# Patient Record
Sex: Female | Born: 1945 | Race: White | Hispanic: No | Marital: Married | Smoking: Never smoker
Health system: Southern US, Community
[De-identification: ages and names within clinical notes are randomized; demographics above are authoritative.]

## PROBLEM LIST (undated history)

## (undated) DIAGNOSIS — T7840XA Allergy, unspecified, initial encounter: Secondary | ICD-10-CM

## (undated) DIAGNOSIS — L719 Rosacea, unspecified: Secondary | ICD-10-CM

## (undated) DIAGNOSIS — M51369 Other intervertebral disc degeneration, lumbar region without mention of lumbar back pain or lower extremity pain: Secondary | ICD-10-CM

## (undated) DIAGNOSIS — H269 Unspecified cataract: Secondary | ICD-10-CM

## (undated) DIAGNOSIS — M5136 Other intervertebral disc degeneration, lumbar region: Secondary | ICD-10-CM

## (undated) DIAGNOSIS — E785 Hyperlipidemia, unspecified: Secondary | ICD-10-CM

## (undated) DIAGNOSIS — K5790 Diverticulosis of intestine, part unspecified, without perforation or abscess without bleeding: Secondary | ICD-10-CM

## (undated) DIAGNOSIS — E039 Hypothyroidism, unspecified: Secondary | ICD-10-CM

## (undated) DIAGNOSIS — N6019 Diffuse cystic mastopathy of unspecified breast: Secondary | ICD-10-CM

## (undated) DIAGNOSIS — N200 Calculus of kidney: Secondary | ICD-10-CM

## (undated) DIAGNOSIS — M67439 Ganglion, unspecified wrist: Secondary | ICD-10-CM

## (undated) DIAGNOSIS — R87619 Unspecified abnormal cytological findings in specimens from cervix uteri: Secondary | ICD-10-CM

## (undated) DIAGNOSIS — IMO0002 Reserved for concepts with insufficient information to code with codable children: Secondary | ICD-10-CM

## (undated) DIAGNOSIS — D3130 Benign neoplasm of unspecified choroid: Secondary | ICD-10-CM

## (undated) DIAGNOSIS — K219 Gastro-esophageal reflux disease without esophagitis: Secondary | ICD-10-CM

## (undated) DIAGNOSIS — K2270 Barrett's esophagus without dysplasia: Secondary | ICD-10-CM

## (undated) DIAGNOSIS — A048 Other specified bacterial intestinal infections: Secondary | ICD-10-CM

## (undated) HISTORY — PX: LAPAROSCOPY: SHX197

## (undated) HISTORY — DX: Ganglion, unspecified wrist: M67.439

## (undated) HISTORY — DX: Other intervertebral disc degeneration, lumbar region without mention of lumbar back pain or lower extremity pain: M51.369

## (undated) HISTORY — DX: Hyperlipidemia, unspecified: E78.5

## (undated) HISTORY — PX: TUBAL LIGATION: SHX77

## (undated) HISTORY — DX: Unspecified cataract: H26.9

## (undated) HISTORY — DX: Benign neoplasm of unspecified choroid: D31.30

## (undated) HISTORY — DX: Diffuse cystic mastopathy of unspecified breast: N60.19

## (undated) HISTORY — DX: Other intervertebral disc degeneration, lumbar region: M51.36

## (undated) HISTORY — DX: Allergy, unspecified, initial encounter: T78.40XA

## (undated) HISTORY — DX: Unspecified abnormal cytological findings in specimens from cervix uteri: R87.619

## (undated) HISTORY — DX: Diverticulosis of intestine, part unspecified, without perforation or abscess without bleeding: K57.90

## (undated) HISTORY — DX: Rosacea, unspecified: L71.9

## (undated) HISTORY — DX: Gastro-esophageal reflux disease without esophagitis: K21.9

## (undated) HISTORY — DX: Hypothyroidism, unspecified: E03.9

## (undated) HISTORY — PX: OTHER SURGICAL HISTORY: SHX169

## (undated) HISTORY — DX: Other specified bacterial intestinal infections: A04.8

## (undated) HISTORY — DX: Reserved for concepts with insufficient information to code with codable children: IMO0002

## (undated) HISTORY — PX: DILATION AND CURETTAGE OF UTERUS: SHX78

## (undated) HISTORY — DX: Calculus of kidney: N20.0

---

## 1966-02-24 HISTORY — PX: TONSILLECTOMY: SUR1361

## 1970-02-24 HISTORY — PX: THYROIDECTOMY: SHX17

## 1998-09-20 ENCOUNTER — Other Ambulatory Visit: Admission: RE | Admit: 1998-09-20 | Discharge: 1998-09-20 | Payer: Self-pay | Admitting: *Deleted

## 1999-02-15 ENCOUNTER — Encounter (INDEPENDENT_AMBULATORY_CARE_PROVIDER_SITE_OTHER): Payer: Self-pay | Admitting: *Deleted

## 1999-02-15 ENCOUNTER — Other Ambulatory Visit: Admission: RE | Admit: 1999-02-15 | Discharge: 1999-02-15 | Payer: Self-pay | Admitting: *Deleted

## 1999-09-27 ENCOUNTER — Other Ambulatory Visit: Admission: RE | Admit: 1999-09-27 | Discharge: 1999-09-27 | Payer: Self-pay | Admitting: *Deleted

## 1999-11-12 ENCOUNTER — Other Ambulatory Visit: Admission: RE | Admit: 1999-11-12 | Discharge: 1999-11-12 | Payer: Self-pay | Admitting: *Deleted

## 2000-09-25 ENCOUNTER — Encounter (INDEPENDENT_AMBULATORY_CARE_PROVIDER_SITE_OTHER): Payer: Self-pay | Admitting: Specialist

## 2000-09-25 ENCOUNTER — Other Ambulatory Visit: Admission: RE | Admit: 2000-09-25 | Discharge: 2000-09-25 | Payer: Self-pay | Admitting: *Deleted

## 2000-11-12 ENCOUNTER — Other Ambulatory Visit: Admission: RE | Admit: 2000-11-12 | Discharge: 2000-11-12 | Payer: Self-pay | Admitting: *Deleted

## 2000-11-12 ENCOUNTER — Encounter (INDEPENDENT_AMBULATORY_CARE_PROVIDER_SITE_OTHER): Payer: Self-pay

## 2001-10-12 ENCOUNTER — Other Ambulatory Visit: Admission: RE | Admit: 2001-10-12 | Discharge: 2001-10-12 | Payer: Self-pay | Admitting: *Deleted

## 2002-01-03 ENCOUNTER — Encounter: Admission: RE | Admit: 2002-01-03 | Discharge: 2002-01-03 | Payer: Self-pay | Admitting: Family Medicine

## 2002-01-03 ENCOUNTER — Encounter: Payer: Self-pay | Admitting: Family Medicine

## 2002-10-17 ENCOUNTER — Other Ambulatory Visit: Admission: RE | Admit: 2002-10-17 | Discharge: 2002-10-17 | Payer: Self-pay | Admitting: *Deleted

## 2002-11-02 ENCOUNTER — Encounter: Admission: RE | Admit: 2002-11-02 | Discharge: 2002-11-02 | Payer: Self-pay | Admitting: General Surgery

## 2002-11-02 ENCOUNTER — Encounter: Payer: Self-pay | Admitting: General Surgery

## 2003-02-25 HISTORY — PX: ABDOMINAL HYSTERECTOMY: SHX81

## 2003-02-25 HISTORY — PX: BILATERAL SALPINGOOPHORECTOMY: SHX1223

## 2003-06-25 HISTORY — PX: BREAST CYST ASPIRATION: SHX578

## 2003-11-01 ENCOUNTER — Other Ambulatory Visit: Admission: RE | Admit: 2003-11-01 | Discharge: 2003-11-01 | Payer: Self-pay | Admitting: *Deleted

## 2003-11-23 ENCOUNTER — Encounter: Admission: RE | Admit: 2003-11-23 | Discharge: 2003-11-23 | Payer: Self-pay | Admitting: Family Medicine

## 2003-12-12 ENCOUNTER — Other Ambulatory Visit: Admission: RE | Admit: 2003-12-12 | Discharge: 2003-12-12 | Payer: Self-pay | Admitting: *Deleted

## 2003-12-25 ENCOUNTER — Encounter: Admission: RE | Admit: 2003-12-25 | Discharge: 2003-12-25 | Payer: Self-pay | Admitting: Family Medicine

## 2004-01-12 ENCOUNTER — Ambulatory Visit: Payer: Self-pay | Admitting: Family Medicine

## 2004-02-20 ENCOUNTER — Ambulatory Visit: Payer: Self-pay | Admitting: Internal Medicine

## 2004-02-25 HISTORY — PX: COLONOSCOPY: SHX174

## 2004-02-27 ENCOUNTER — Ambulatory Visit: Payer: Self-pay | Admitting: Internal Medicine

## 2004-02-27 LAB — HM COLONOSCOPY

## 2004-03-07 ENCOUNTER — Encounter: Admission: RE | Admit: 2004-03-07 | Discharge: 2004-03-07 | Payer: Self-pay | Admitting: General Surgery

## 2004-03-14 ENCOUNTER — Encounter: Payer: Self-pay | Admitting: Family Medicine

## 2004-03-14 LAB — CONVERTED CEMR LAB: Pap Smear: NORMAL

## 2004-07-08 ENCOUNTER — Other Ambulatory Visit: Admission: RE | Admit: 2004-07-08 | Discharge: 2004-07-08 | Payer: Self-pay | Admitting: *Deleted

## 2004-08-23 ENCOUNTER — Ambulatory Visit: Payer: Self-pay | Admitting: Family Medicine

## 2004-09-09 ENCOUNTER — Ambulatory Visit: Payer: Self-pay | Admitting: Internal Medicine

## 2004-10-16 ENCOUNTER — Ambulatory Visit: Payer: Self-pay | Admitting: Family Medicine

## 2004-12-12 ENCOUNTER — Other Ambulatory Visit: Admission: RE | Admit: 2004-12-12 | Discharge: 2004-12-12 | Payer: Self-pay | Admitting: Family Medicine

## 2004-12-12 ENCOUNTER — Encounter: Payer: Self-pay | Admitting: Family Medicine

## 2004-12-12 ENCOUNTER — Ambulatory Visit: Payer: Self-pay | Admitting: Family Medicine

## 2005-01-31 ENCOUNTER — Ambulatory Visit: Payer: Self-pay | Admitting: Family Medicine

## 2005-02-03 ENCOUNTER — Encounter: Admission: RE | Admit: 2005-02-03 | Discharge: 2005-02-12 | Payer: Self-pay | Admitting: Family Medicine

## 2005-02-21 ENCOUNTER — Ambulatory Visit: Payer: Self-pay | Admitting: Family Medicine

## 2005-02-27 ENCOUNTER — Encounter: Admission: RE | Admit: 2005-02-27 | Discharge: 2005-02-27 | Payer: Self-pay | Admitting: Family Medicine

## 2005-04-03 ENCOUNTER — Encounter: Admission: RE | Admit: 2005-04-03 | Discharge: 2005-07-02 | Payer: Self-pay | Admitting: Orthopedic Surgery

## 2005-04-04 ENCOUNTER — Ambulatory Visit: Payer: Self-pay | Admitting: Family Medicine

## 2005-04-23 ENCOUNTER — Encounter: Admission: RE | Admit: 2005-04-23 | Discharge: 2005-04-23 | Payer: Self-pay | Admitting: General Surgery

## 2005-05-29 ENCOUNTER — Ambulatory Visit: Payer: Self-pay | Admitting: Family Medicine

## 2005-11-11 ENCOUNTER — Ambulatory Visit: Payer: Self-pay | Admitting: Family Medicine

## 2005-12-25 ENCOUNTER — Other Ambulatory Visit: Admission: RE | Admit: 2005-12-25 | Discharge: 2005-12-25 | Payer: Self-pay | Admitting: *Deleted

## 2006-01-12 ENCOUNTER — Ambulatory Visit: Payer: Self-pay | Admitting: Family Medicine

## 2006-01-21 ENCOUNTER — Ambulatory Visit: Payer: Self-pay | Admitting: Family Medicine

## 2006-03-31 ENCOUNTER — Ambulatory Visit: Payer: Self-pay | Admitting: Family Medicine

## 2006-04-27 ENCOUNTER — Encounter: Admission: RE | Admit: 2006-04-27 | Discharge: 2006-04-27 | Payer: Self-pay | Admitting: *Deleted

## 2006-05-18 ENCOUNTER — Encounter: Payer: Self-pay | Admitting: Family Medicine

## 2006-05-18 DIAGNOSIS — L719 Rosacea, unspecified: Secondary | ICD-10-CM | POA: Insufficient documentation

## 2006-05-18 DIAGNOSIS — IMO0002 Reserved for concepts with insufficient information to code with codable children: Secondary | ICD-10-CM | POA: Insufficient documentation

## 2006-05-18 DIAGNOSIS — E785 Hyperlipidemia, unspecified: Secondary | ICD-10-CM

## 2006-05-18 DIAGNOSIS — E039 Hypothyroidism, unspecified: Secondary | ICD-10-CM

## 2006-05-18 DIAGNOSIS — T7840XA Allergy, unspecified, initial encounter: Secondary | ICD-10-CM | POA: Insufficient documentation

## 2006-05-18 DIAGNOSIS — N200 Calculus of kidney: Secondary | ICD-10-CM | POA: Insufficient documentation

## 2006-05-18 DIAGNOSIS — N6019 Diffuse cystic mastopathy of unspecified breast: Secondary | ICD-10-CM

## 2006-05-18 DIAGNOSIS — K5730 Diverticulosis of large intestine without perforation or abscess without bleeding: Secondary | ICD-10-CM | POA: Insufficient documentation

## 2006-07-28 ENCOUNTER — Ambulatory Visit: Payer: Self-pay | Admitting: Family Medicine

## 2006-07-29 ENCOUNTER — Encounter: Payer: Self-pay | Admitting: Family Medicine

## 2006-08-06 ENCOUNTER — Encounter: Payer: Self-pay | Admitting: Family Medicine

## 2006-08-06 ENCOUNTER — Ambulatory Visit: Payer: Self-pay | Admitting: Internal Medicine

## 2006-08-24 ENCOUNTER — Encounter (INDEPENDENT_AMBULATORY_CARE_PROVIDER_SITE_OTHER): Payer: Self-pay | Admitting: *Deleted

## 2006-09-29 ENCOUNTER — Ambulatory Visit: Payer: Self-pay | Admitting: Family Medicine

## 2006-09-29 LAB — CONVERTED CEMR LAB
Glucose, Urine, Semiquant: NEGATIVE
Ketones, urine, test strip: NEGATIVE
Nitrite: NEGATIVE
Urine crystals, microscopic: 0 /hpf
Yeast, UA: 0

## 2006-09-30 ENCOUNTER — Encounter: Payer: Self-pay | Admitting: Family Medicine

## 2006-10-09 ENCOUNTER — Ambulatory Visit: Payer: Self-pay | Admitting: Family Medicine

## 2006-10-13 ENCOUNTER — Encounter: Payer: Self-pay | Admitting: Family Medicine

## 2006-10-13 ENCOUNTER — Telehealth (INDEPENDENT_AMBULATORY_CARE_PROVIDER_SITE_OTHER): Payer: Self-pay | Admitting: *Deleted

## 2006-10-13 LAB — CONVERTED CEMR LAB
Basophils Absolute: 0 10*3/uL (ref 0.0–0.1)
Eosinophils Absolute: 0.2 10*3/uL (ref 0.0–0.6)
Eosinophils Relative: 2.7 % (ref 0.0–5.0)
HCT: 39.9 % (ref 36.0–46.0)
Hemoglobin: 13.8 g/dL (ref 12.0–15.0)
Lymphocytes Relative: 36.7 % (ref 12.0–46.0)
Monocytes Absolute: 0.5 10*3/uL (ref 0.2–0.7)
Neutro Abs: 3.2 10*3/uL (ref 1.4–7.7)
Neutrophils Relative %: 51.8 % (ref 43.0–77.0)
RBC: 4.12 M/uL (ref 3.87–5.11)
WBC: 6.1 10*3/uL (ref 4.5–10.5)

## 2006-12-28 ENCOUNTER — Other Ambulatory Visit: Admission: RE | Admit: 2006-12-28 | Discharge: 2006-12-28 | Payer: Self-pay | Admitting: *Deleted

## 2007-01-23 ENCOUNTER — Encounter: Payer: Self-pay | Admitting: Family Medicine

## 2007-01-25 ENCOUNTER — Ambulatory Visit: Payer: Self-pay | Admitting: Internal Medicine

## 2007-02-09 ENCOUNTER — Ambulatory Visit: Payer: Self-pay | Admitting: Family Medicine

## 2007-04-06 ENCOUNTER — Ambulatory Visit: Payer: Self-pay | Admitting: Family Medicine

## 2007-05-05 ENCOUNTER — Encounter: Admission: RE | Admit: 2007-05-05 | Discharge: 2007-05-05 | Payer: Self-pay | Admitting: *Deleted

## 2007-05-23 ENCOUNTER — Encounter: Payer: Self-pay | Admitting: Family Medicine

## 2007-06-11 ENCOUNTER — Ambulatory Visit: Payer: Self-pay | Admitting: Family Medicine

## 2007-06-14 ENCOUNTER — Telehealth: Payer: Self-pay | Admitting: Family Medicine

## 2007-08-03 ENCOUNTER — Encounter: Payer: Self-pay | Admitting: Family Medicine

## 2007-09-16 ENCOUNTER — Ambulatory Visit: Payer: Self-pay | Admitting: Family Medicine

## 2007-09-22 ENCOUNTER — Ambulatory Visit: Payer: Self-pay | Admitting: Family Medicine

## 2007-12-29 ENCOUNTER — Other Ambulatory Visit: Admission: RE | Admit: 2007-12-29 | Discharge: 2007-12-29 | Payer: Self-pay | Admitting: Gynecology

## 2008-02-25 HISTORY — PX: BUNIONECTOMY: SHX129

## 2008-03-22 ENCOUNTER — Ambulatory Visit: Payer: Self-pay | Admitting: Family Medicine

## 2008-03-27 ENCOUNTER — Ambulatory Visit: Payer: Self-pay | Admitting: Family Medicine

## 2008-05-15 ENCOUNTER — Encounter: Admission: RE | Admit: 2008-05-15 | Discharge: 2008-05-15 | Payer: Self-pay | Admitting: Family Medicine

## 2008-05-17 ENCOUNTER — Encounter (INDEPENDENT_AMBULATORY_CARE_PROVIDER_SITE_OTHER): Payer: Self-pay | Admitting: *Deleted

## 2008-05-25 ENCOUNTER — Encounter: Payer: Self-pay | Admitting: Family Medicine

## 2008-06-07 ENCOUNTER — Encounter: Payer: Self-pay | Admitting: Family Medicine

## 2008-07-20 ENCOUNTER — Encounter: Payer: Self-pay | Admitting: Family Medicine

## 2008-08-11 ENCOUNTER — Encounter (INDEPENDENT_AMBULATORY_CARE_PROVIDER_SITE_OTHER): Payer: Self-pay | Admitting: *Deleted

## 2008-08-29 ENCOUNTER — Ambulatory Visit: Payer: Self-pay | Admitting: Family Medicine

## 2008-08-29 DIAGNOSIS — Z780 Asymptomatic menopausal state: Secondary | ICD-10-CM | POA: Insufficient documentation

## 2008-09-05 ENCOUNTER — Encounter: Payer: Self-pay | Admitting: Family Medicine

## 2008-09-05 ENCOUNTER — Ambulatory Visit: Payer: Self-pay | Admitting: Internal Medicine

## 2008-10-12 ENCOUNTER — Ambulatory Visit: Payer: Self-pay | Admitting: Family Medicine

## 2008-11-20 ENCOUNTER — Ambulatory Visit: Payer: Self-pay | Admitting: Family Medicine

## 2008-11-27 ENCOUNTER — Ambulatory Visit: Payer: Self-pay | Admitting: Family Medicine

## 2008-11-27 LAB — CONVERTED CEMR LAB
ALT: 39 units/L — ABNORMAL HIGH (ref 0–35)
Cholesterol, target level: 200 mg/dL
Cholesterol: 232 mg/dL — ABNORMAL HIGH (ref 0–200)
Direct LDL: 162.3 mg/dL
HDL goal, serum: 40 mg/dL
TSH: 0.99 microintl units/mL (ref 0.35–5.50)
Triglycerides: 138 mg/dL (ref 0.0–149.0)

## 2009-01-08 ENCOUNTER — Ambulatory Visit: Payer: Self-pay | Admitting: Family Medicine

## 2009-01-09 LAB — CONVERTED CEMR LAB
ALT: 50 units/L — ABNORMAL HIGH (ref 0–35)
AST: 46 units/L — ABNORMAL HIGH (ref 0–37)
Glucose, Bld: 108 mg/dL — ABNORMAL HIGH (ref 70–99)
HDL: 54.5 mg/dL (ref >39.00)
VLDL: 22.4 mg/dL (ref 0.0–40.0)

## 2009-01-23 ENCOUNTER — Ambulatory Visit: Payer: Self-pay | Admitting: Family Medicine

## 2009-01-24 ENCOUNTER — Encounter: Payer: Self-pay | Admitting: Family Medicine

## 2009-01-25 ENCOUNTER — Encounter: Admission: RE | Admit: 2009-01-25 | Discharge: 2009-01-25 | Payer: Self-pay | Admitting: General Surgery

## 2009-02-19 ENCOUNTER — Encounter: Admission: RE | Admit: 2009-02-19 | Discharge: 2009-02-19 | Payer: Self-pay | Admitting: General Surgery

## 2009-02-20 ENCOUNTER — Ambulatory Visit: Payer: Self-pay | Admitting: Family Medicine

## 2009-02-21 LAB — CONVERTED CEMR LAB: Cholesterol: 200 mg/dL (ref 0–200)

## 2009-02-24 HISTORY — PX: METATARSAL OSTEOTOMY WITH BUNIONECTOMY: SHX5662

## 2009-04-13 ENCOUNTER — Encounter: Payer: Self-pay | Admitting: Family Medicine

## 2009-04-24 LAB — CONVERTED CEMR LAB: Pap Smear: NORMAL

## 2009-07-31 ENCOUNTER — Ambulatory Visit: Payer: Self-pay | Admitting: Family Medicine

## 2009-07-31 LAB — CONVERTED CEMR LAB
AST: 30 units/L (ref 0–37)
BUN: 17 mg/dL (ref 6–23)
Basophils Absolute: 0 10*3/uL (ref 0.0–0.1)
Bilirubin, Direct: 0.1 mg/dL (ref 0.0–0.3)
CO2: 29 meq/L (ref 19–32)
Calcium: 9.7 mg/dL (ref 8.4–10.5)
Chloride: 106 meq/L (ref 96–112)
Cholesterol: 176 mg/dL (ref 0–200)
Eosinophils Absolute: 0.2 10*3/uL (ref 0.0–0.7)
GFR calc non Af Amer: 95.87 mL/min (ref >60)
Glucose, Bld: 105 mg/dL — ABNORMAL HIGH (ref 70–99)
HCT: 39.4 % (ref 36.0–46.0)
Hemoglobin: 13.8 g/dL (ref 12.0–15.0)
LDL Cholesterol: 96 mg/dL (ref 0–99)
Monocytes Absolute: 0.4 10*3/uL (ref 0.1–1.0)
Monocytes Relative: 6.2 % (ref 3.0–12.0)
Neutro Abs: 3.2 10*3/uL (ref 1.4–7.7)
Platelets: 224 10*3/uL (ref 150.0–400.0)
Potassium: 5.1 meq/L (ref 3.5–5.1)
Sodium: 142 meq/L (ref 135–145)
TSH: 0.58 microintl units/mL (ref 0.35–5.50)
Total Bilirubin: 0.5 mg/dL (ref 0.3–1.2)
Total Protein: 6 g/dL (ref 6.0–8.3)
Triglycerides: 98 mg/dL (ref 0.0–149.0)

## 2009-08-22 ENCOUNTER — Ambulatory Visit: Payer: Self-pay | Admitting: Family Medicine

## 2009-08-22 DIAGNOSIS — R739 Hyperglycemia, unspecified: Secondary | ICD-10-CM

## 2009-09-06 ENCOUNTER — Ambulatory Visit: Payer: Self-pay | Admitting: Internal Medicine

## 2009-09-06 ENCOUNTER — Ambulatory Visit: Payer: Self-pay | Admitting: Family Medicine

## 2009-09-06 DIAGNOSIS — M545 Low back pain: Secondary | ICD-10-CM

## 2009-09-06 DIAGNOSIS — Z87442 Personal history of urinary calculi: Secondary | ICD-10-CM

## 2009-09-06 LAB — CONVERTED CEMR LAB
Glucose, Urine, Semiquant: NEGATIVE
Specific Gravity, Urine: >=1.03
WBC Urine, dipstick: NEGATIVE
pH: 5

## 2009-09-07 ENCOUNTER — Encounter: Payer: Self-pay | Admitting: Family Medicine

## 2009-12-27 ENCOUNTER — Ambulatory Visit: Payer: Self-pay | Admitting: Family Medicine

## 2009-12-27 DIAGNOSIS — M25519 Pain in unspecified shoulder: Secondary | ICD-10-CM | POA: Insufficient documentation

## 2010-02-05 ENCOUNTER — Ambulatory Visit: Payer: Self-pay | Admitting: Family Medicine

## 2010-03-17 ENCOUNTER — Encounter: Payer: Self-pay | Admitting: Family Medicine

## 2010-03-22 ENCOUNTER — Encounter
Admission: RE | Admit: 2010-03-22 | Discharge: 2010-03-22 | Payer: Self-pay | Source: Home / Self Care | Attending: General Surgery | Admitting: General Surgery

## 2010-03-26 NOTE — Letter (Signed)
Summary: [facility] Surgery   Imported By: Maryln Gottron 05/15/2009 13:16:34  _____________________________________________________________________  External Attachment:    Type:   Image     Comment:   External Document
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
## 2010-03-26 NOTE — Assessment & Plan Note (Signed)
Summary: L SHOULDER,ARM PAIN/CLE   Vital Signs:  Patient profile:   65 year old female Height:      63.75 inches Weight:      169.50 pounds BMI:     29.43 Temp:     97.8 degrees F oral Pulse rate:   84 / minute Pulse rhythm:   regular BP sitting:   112 / 76  (right arm) Cuff size:   regular  Vitals Entered By: Delilah Shan CMA Chaden Doom Dull) (December 27, 2009 3:12 PM) CC: Left shoulder and arm pain   History of Present Illness: L shoulder and arm pain.  Started yesterday.  Strained to open jars yesterday.  Dec in range of motion today.  pain from L shoulder to elbow, lateral and anterior.  No pop, snap or click recalled.  Pain started after the activity.  No CP, SOB.    Allergies: 1)  ! Flagyl 2)  ! Cipro 3)  ! Sulfa 4)  ! * Latex 5)  ! Adhesive Tape  Social History: non smoker drinks occasional wine  potter, Nature conservation officer  Review of Systems       See HPI.  Otherwise negative.    Physical Exam  General:  NAD Normal range of motion at the neck, no midline pain. L shoulder with pain on int/ext rotation, + impingement, decrease in range of motion due to pain (arom<prom) globally.  Unable to abduct or extend >90deg due to pain.  distally nv intact, normal range of motion at elbow.  grip wnl. No ac pain on testing.    Impression & Recommendations:  Problem # 1:  SHOULDER PAIN, LEFT (ICD-719.41) Likely rotator cuff strain, potentially with deltoid component.  I don't suspect frozen shoulder, but she needs to increase range of motion to prevent this.  d/w patient re: exercising and pain meds in meantime.  call back if not improving to get patient in with ortho/PT.  She agrees.  I don't think she has a full cuff tear, but if her symptoms continue she would need eval for this.  I d/w her about this.  I don't see need to image currently as it wouldn't change plan for now.  Her updated medication list for this problem includes:    Aspirin 81 Mg Tabs (Aspirin) ..... One  daily    Ibuprofen 600 Mg Tabs (Ibuprofen) .Marland Kitchen... 1 by mouth three times a day for pain with food    Vicodin 5-500 Mg Tabs (Hydrocodone-acetaminophen) .Marland Kitchen... 1-2 by mouth three times a day for pain not relieved by ibuprofen, sedation caution  Complete Medication List: 1)  Caltrate 600 Tabs (Calcium carbonate tabs) .... Take one by mouth daily 2)  Glucosamine-chondroitin Caps (Glucosamine-chondroit-vit c-mn) .... Take one by mouth daily 3)  Fexofenadine Hcl 180 Mg Tabs (Fexofenadine hcl) .... Take one by mouth daily as needed 4)  Flonase 50 Mcg/act Susp (Fluticasone propionate) .... Two sprays in each nostril once a day as needed. 5)  Omega-3 1000 Mg Caps (Omega-3 fatty acids) .... Take 1 tablet by mouth once a day 6)  Zaditor 0.025 % Soln (Ketotifen fumarate) .... Use in each eye as directed 7)  Aspirin 81 Mg Tabs (Aspirin) .... One daily 8)  Synthroid 88 Mcg Tabs (Levothyroxine sodium) .... Take one by mouth daily 9)  Zocor 10 Mg Tabs (Simvastatin) .Marland Kitchen.. 1 by mouth once daily 10)  Systane 0.4-0.3 % Soln (Polyethyl glycol-propyl glycol) .... One drop each eye as needed 11)  I Cap With Lutein and  Vit D3  .... Take 1 tablet by mouth once a day 12)  Ibuprofen 600 Mg Tabs (Ibuprofen) .Marland Kitchen.. 1 by mouth three times a day for pain with food 13)  Vicodin 5-500 Mg Tabs (Hydrocodone-acetaminophen) .Marland Kitchen.. 1-2 by mouth three times a day for pain not relieved by ibuprofen, sedation caution  Patient Instructions: 1)  Rotator cuff strain.  I want you to do pendulum exercises several times day and 'snow angels' where you lay on the bed and (using the bed for support), move your arm away from your side and above your head.  Try to gradually increase your range of motion and let us know if you aren't getting better.  I would take the vicodin as needed for pain not relieved by the ibuprofen.  Take the ibuprofen with food.  The vicodin may make you drowsy.  Take care.  Prescriptions: VICODIN 5-500 MG TABS  (HYDROCODONE-ACETAMINOPHEN) 1-2 by mouth three times a day for pain not relieved by ibuprofen, sedation caution  #30 x 1   Entered and Authorized by:   Crawford Givens MD   Signed by:   Crawford Givens MD on 12/27/2009   Method used:   Print then Give to Patient   RxID:   [id number] IBUPROFEN 600 MG TABS (IBUPROFEN) 1 by mouth three times a day for pain with food  #50 x 1   Entered and Authorized by:   Crawford Givens MD   Signed by:   Crawford Givens MD on 12/27/2009   Method used:   Print then Give to Patient   RxID:   [id number]    Orders Added: 1)  Est. Patient Level III [95284]    Current Allergies (reviewed today): ! FLAGYL ! CIPRO ! SULFA ! * LATEX ! ADHESIVE TAPE
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
## 2010-04-22 ENCOUNTER — Ambulatory Visit (INDEPENDENT_AMBULATORY_CARE_PROVIDER_SITE_OTHER): Payer: 59 | Admitting: Family Medicine

## 2010-04-22 ENCOUNTER — Encounter: Payer: Self-pay | Admitting: Family Medicine

## 2010-04-22 DIAGNOSIS — M549 Dorsalgia, unspecified: Secondary | ICD-10-CM

## 2010-04-30 ENCOUNTER — Encounter: Payer: Self-pay | Admitting: Family Medicine

## 2010-05-02 NOTE — Assessment & Plan Note (Signed)
Summary: BACK PAIN/CLE  [facility]   Vital Signs:  Patient profile:   65 year old female Height:      63.75 inches Weight:      145.75 pounds BMI:     25.31 Temp:     97.6 degrees F oral Pulse rate:   76 / minute Pulse rhythm:   regular BP sitting:   130 / 76  (left arm) Cuff size:   regular  Vitals Entered By: Delilah Shan CMA Allan Minotti Dull) (April 22, 2010 10:19 AM) CC: Back pain   History of Present Illness: She didn't need the hydrocodone from the prev shoulder pain- this resolved.  She still has some ibuprofen left over.    Now with back pain.  She was picking up something, twisted and bent at the same time. "It's always on the left side when it happens."  She has gone thought physical therapy before.  She was stretching at home and using heat.  Trouble getting off/on the toile and in/out of bed.  She took 2 aleve Friday w/o much relief.  Used ibuprofen Saturday/Sunday.  "It's not fixed but it's not as bad as it was."  Still with tight sensation in L lower back. No radicular symptoms.  Spasms noted in back initially but getting some better.  Had used muscle relaxers before but didn't have any left over.  H/o degenerative changes in lower back with prev ortho eval.  She prev did well with flexeril.    Allergies: 1)  ! Flagyl 2)  ! Cipro 3)  ! Sulfa 4)  ! * Latex 5)  ! Adhesive Tape  Review of Systems       See HPI.  Otherwise negative.    Physical Exam  General:  no apparent distress normocephalic atraumatic mucous membranes moist regular rate and rhythm back w/o midline pain L paraspinal L spine muscles tender to palpation distally nv intact with normal dts bilaterally no weakness in bilateral lower extremities   Impression & Recommendations:  Problem # 1:  BACK PAIN (ICD-724.5)  L lumbar paraspinal muscle pain.  D/w patient GL:OVFI, stretching, ibuprofen with GI caution and using the flexeril as needed with sedation caution.  this should gradually improve.  follow up as  needed.  No indication for imaging.  She agrees with plan.  Her updated medication list for this problem includes:    Aspirin 81 Mg Tabs (Aspirin) ..... One daily    Ibuprofen 600 Mg Tabs (Ibuprofen) .Marland Kitchen... 1 by mouth three times a day for pain with food    Vicodin 5-500 Mg Tabs (Hydrocodone-acetaminophen) .Marland Kitchen... 1-2 by mouth three times a day for pain not relieved by ibuprofen, sedation caution    Flexeril 10 Mg Tabs (Cyclobenzaprine hcl) .Marland Kitchen... 1/2 to 1 tab by mouth three times a day as needed for back pain with sedation caution  Orders: Prescription Created Electronically [phone])  Complete Medication List: 1)  Caltrate 600 Tabs (Calcium carbonate tabs) .... Take one by mouth daily 2)  Glucosamine-chondroitin Caps (Glucosamine-chondroit-vit c-mn) .... Take one by mouth daily 3)  Fexofenadine Hcl 180 Mg Tabs (Fexofenadine hcl) .... Take one by mouth daily as needed 4)  Flonase 50 Mcg/act Susp (Fluticasone propionate) .... Two sprays in each nostril once a day as needed. 5)  Omega-3 1000 Mg Caps (Omega-3 fatty acids) .... Take 1 tablet by mouth once a day 6)  Zaditor 0.025 % Soln (Ketotifen fumarate) .... Use in each eye as directed 7)  Aspirin 81 Mg Tabs (Aspirin) .Marland KitchenMarland KitchenMarland Kitchen  One daily 8)  Synthroid 88 Mcg Tabs (Levothyroxine sodium) .... Take one by mouth daily 9)  Zocor 10 Mg Tabs (Simvastatin) .Marland Kitchen.. 1 by mouth once daily 10)  Systane 0.4-0.3 % Soln (Polyethyl glycol-propyl glycol) .... One drop each eye as needed 11)  I Cap With Lutein and Vit D3  .... Take 1 tablet by mouth once a day 12)  Ibuprofen 600 Mg Tabs (Ibuprofen) .Marland Kitchen.. 1 by mouth three times a day for pain with food 13)  Vicodin 5-500 Mg Tabs (Hydrocodone-acetaminophen) .Marland Kitchen.. 1-2 by mouth three times a day for pain not relieved by ibuprofen, sedation caution 14)  Flexeril 10 Mg Tabs (Cyclobenzaprine hcl) .... 1/2 to 1 tab by mouth three times a day as needed for back pain with sedation caution  Patient Instructions: 1)  Use the flexeril  as needed. It can make you drowsy.  Take the ibuprofen with food and keep stretching.  Let us know if you don't gradually improve.  Take care.  Prescriptions: FLEXERIL 10 MG TABS (CYCLOBENZAPRINE HCL) 1/2 to 1 tab by mouth three times a day as needed for back pain with sedation caution  #30 x 1   Entered and Authorized by:   Crawford Givens MD   Signed by:   Crawford Givens MD on 04/22/2010   Method used:   Electronically to        [facility]* (retail)       [address]       [place], [place]  [address]       Ph: [phone]       Fax: [phone]   RxID:   [id number]    Orders Added: 1)  Est. Patient Level III [60630] 2)  Prescription Created Electronically [phone]    Current Allergies (reviewed today): ! FLAGYL ! CIPRO ! SULFA ! * LATEX ! ADHESIVE TAPE

## 2010-05-14 NOTE — Letter (Signed)
Summary: [facility]  [facility]   Imported By: Maryln Gottron 05/02/2010 15:27:51  _____________________________________________________________________  External Attachment:    Type:   Image     Comment:   External Document

## 2010-07-01 ENCOUNTER — Encounter (INDEPENDENT_AMBULATORY_CARE_PROVIDER_SITE_OTHER): Payer: Self-pay | Admitting: General Surgery

## 2010-07-15 ENCOUNTER — Encounter: Payer: Self-pay | Admitting: Family Medicine

## 2010-07-16 ENCOUNTER — Encounter: Payer: Self-pay | Admitting: Family Medicine

## 2010-07-16 ENCOUNTER — Ambulatory Visit (INDEPENDENT_AMBULATORY_CARE_PROVIDER_SITE_OTHER): Payer: 59 | Admitting: Family Medicine

## 2010-07-16 VITALS — BP 120/76 | HR 72 | Temp 97.7°F | Ht 63.75 in | Wt 164.0 lb

## 2010-07-16 DIAGNOSIS — K219 Gastro-esophageal reflux disease without esophagitis: Secondary | ICD-10-CM

## 2010-07-16 MED ORDER — OMEPRAZOLE 20 MG PO CPDR: 20.0000 mg | DELAYED_RELEASE_CAPSULE | Freq: Every day | ORAL | Status: DC

## 2010-07-16 NOTE — Progress Notes (Signed)
Subjective:    Patient ID: Nancy [name], female    DOB: April 13, 1945, 65 y.o.   MRN: [id number]  HPI Here for acid reflux symptoms   Has been here twice for back pain -- Dr Para March -- hydrocodone and nsaid  Had bad reflux reaction from it - especially at night  Once she stopped the meds was generally ok unless she eats something very spicy  Last Thursday night had chicken and salad for dinner Acid refluxed into throat at night -- made her cough and then chest burned for a long time afterward  Started taking otc prevacid 15 mg otc daily  Helps some in general  occ breakthrough symptoms and had to take 2nd pill and some tums   Long time since any nsaid -- many months   Wt is stable   Some stress-that could contribute   Mother- huge HH Sister huge HH - is quite severe - may have to have surgery -- is in her 25s  (also has melanoma)       Wt was incorrect last time- wt is stable   Past Medical History  Diagnosis Date  . Thyroid disease   . Diverticulosis   . Hyperlipidemia   . 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 Fibrocystic breast   . Nephrolithiasis     Hx of    History   Social History  . Marital Status: Married    Spouse Name: N/A    Number of Children: N/A  . Years of Education: N/A   Occupational History  . Not on file.   Social History Main Topics  . Smoking status: Never Smoker   . Smokeless tobacco: Not on file  . Alcohol Use: Yes     2-3 GLASSES OF WINE A WEEK  . Drug Use: No  . Sexually Active:    Other Topics Concern  . Not on file   Social History Narrative  . No narrative on file         Review of Systems Review of Systems  Constitutional: Negative for fever, appetite change, fatigue and unexpected weight change.  Eyes: Negative for pain and visual disturbance.  Respiratory: Negative for cough and shortness of breath.   Cardiovascular: Negative.for cp or edema or sob   Gastrointestinal: Negative for nausea, diarrhea and  constipation. Neg for abd pain, pos for reflux  Genitourinary: Negative for urgency and frequency.  Skin: Negative for pallor. or rash  Neurological: Negative for weakness, light-headedness, numbness and headaches.  Hematological: Negative for adenopathy. Does not bruise/bleed easily.  Psychiatric/Behavioral: Negative for dysphoric mood. The patient is not nervous/anxious.          Objective:   Physical Exam  Constitutional: She appears well-developed and well-nourished. No distress.  HENT:  Head: Normocephalic and atraumatic.  Mouth/Throat: Oropharynx is clear and moist.  Eyes: Conjunctivae and EOM are normal. Pupils are equal, round, and reactive to light.  Neck: Normal range of motion. Neck supple. No thyromegaly present.  Cardiovascular: Normal rate, regular rhythm and normal heart sounds.   Pulmonary/Chest: Effort normal and breath sounds normal. No respiratory distress. She has no wheezes.  Abdominal: Soft. Bowel sounds are normal. She exhibits no distension and no mass. There is no tenderness.  Musculoskeletal: She exhibits no edema.  Lymphadenopathy:    She has no cervical adenopathy.  Neurological: She is alert. She has normal reflexes. A cranial nerve deficit is present.  Skin: Skin is warm and dry. No rash noted.  No pallor.  Psychiatric: She has a normal mood and affect.          Assessment & Plan:

## 2010-07-16 NOTE — Patient Instructions (Signed)
Stop spicy food  Do not eat before bedtime  Do not overeat Avoid carbonation and caffiene and acidic foods and beverages  Avoid anti inflammatories  Think about elevating head of bed with a few bricks  Start omeprazole 20 mg each day 30 minutes before breakfast if possible  Follow up in 2 months  Also work on weight loss

## 2010-07-16 NOTE — Assessment & Plan Note (Signed)
New with likely hiatal hernia based on symptoms and fam hx  Disc risks of gerd Lifestyle change disc in detail- see inst  Start omeprazole 20 daily in am F/u 2 mo  Update if worse or not imp Will re eval at f/u

## 2010-08-29 ENCOUNTER — Other Ambulatory Visit: Payer: Self-pay | Admitting: *Deleted

## 2010-08-29 MED ORDER — SIMVASTATIN 10 MG PO TABS: 10.0000 mg | ORAL_TABLET | Freq: Every day | ORAL | Status: DC

## 2010-09-16 ENCOUNTER — Ambulatory Visit (INDEPENDENT_AMBULATORY_CARE_PROVIDER_SITE_OTHER): Payer: MEDICARE | Admitting: Family Medicine

## 2010-09-16 ENCOUNTER — Encounter: Payer: Self-pay | Admitting: Family Medicine

## 2010-09-16 VITALS — BP 120/64 | HR 72 | Temp 97.7°F | Ht 63.75 in | Wt 166.5 lb

## 2010-09-16 DIAGNOSIS — T148 Other injury of unspecified body region: Secondary | ICD-10-CM

## 2010-09-16 DIAGNOSIS — T148XXA Other injury of unspecified body region, initial encounter: Secondary | ICD-10-CM

## 2010-09-16 DIAGNOSIS — W57XXXA Bitten or stung by nonvenomous insect and other nonvenomous arthropods, initial encounter: Secondary | ICD-10-CM

## 2010-09-16 DIAGNOSIS — K219 Gastro-esophageal reflux disease without esophagitis: Secondary | ICD-10-CM

## 2010-09-16 NOTE — Progress Notes (Signed)
Subjective:    Patient ID: Nancy [name], female    DOB: Jun 27, 1945, 65 y.o.   MRN: [id number]  HPI Last visit pt was here for acid reflux symptoms  tx with omeprazole 20 mg daily in am   Acid reflux is much better ! Did raise head of the bed 3 inches- helpful  Stopped ice tea  Hard time getting fluids in - does not like water   Finding things she can tolerate   Lost 4 lb and gained it back  Poss from not enough   Got a silver sneakers card [facility]   Has spot on arm- thinks it is a but bite Itchy  A little red   Patient Active Problem List  Diagnoses  . HYPOTHYROIDISM  . HYPERLIPIDEMIA  . DIVERTICULOSIS, COLON  . RENAL CALCULUS  . FIBROCYSTIC BREAST DISEASE  . ROSACEA  . SHOULDER PAIN, LEFT  . DEGENERATIVE DISC DISEASE  . BACK PAIN  . HYPERGLYCEMIA, FASTING  . ALLERGY  . NEPHROLITHIASIS, HX OF  . POSTMENOPAUSAL STATUS  . GERD (gastroesophageal reflux disease)   Past Medical History  Diagnosis Date  . Thyroid disease   . Diverticulosis   . Hyperlipidemia   . 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 Fibrocystic breast   . Nephrolithiasis     Hx of   Past Surgical History  Procedure Date  . Tonsillectomy 1968  . Thyroidectomy 1972  . Abdominal hysterectomy 2005  . Bunionectomy 2010  . Laparoscopy     D&C  . Dilation and curettage of uterus   . Tubal ligation   . Breast cyst aspiration 06/2003    and 04/2004   History  Substance Use Topics  . Smoking status: Never Smoker   . Smokeless tobacco: Not on file  . Alcohol Use: Yes     2-3 GLASSES OF WINE A WEEK   Family History  Problem Relation Age of Onset  . Cancer Father     lung CA  . Cancer Sister     melanoma   Allergies  Allergen Reactions  . Ciprofloxacin     REACTION: rash  . Flagyl (Metronidazole Hcl)   . Latex     REACTION: rash  . Metronidazole   . Sulfa Drugs Cross Reactors   . Sulfonamide Derivatives     REACTION: rash   Current  Outpatient Prescriptions on File Prior to Visit  Medication Sig Dispense Refill  . aspirin 81 MG tablet Take 81 mg by mouth daily.        . Calcium Carbonate (CALTRATE 600 PO) Take 1 tablet by mouth daily.       . Carboxymethylcellul-Glycerin (OPTIVE OP) Apply to eye as needed.       . fluticasone (FLONASE) 50 MCG/ACT nasal spray 1 spray by Nasal route daily as needed.        Marland Kitchen GLUCOSAMINE HCL PO Take 1 tablet by mouth daily.       Marland Kitchen levothyroxine (SYNTHROID, LEVOTHROID) 88 MCG tablet Take 88 mcg by mouth daily.        . Multiple Vitamin (MULTIVITAMIN) capsule Take 1 capsule by mouth daily.        . OMEGA 3 1000 MG CAPS Take 1 capsule by mouth daily.       Marland Kitchen omeprazole (PRILOSEC) 20 MG capsule Take 1 capsule (20 mg total) by mouth daily.  30 capsule  11  . simvastatin (ZOCOR) 10  MG tablet Take 1 tablet (10 mg total) by mouth at bedtime.  30 tablet  6  . fexofenadine (ALLEGRA) 30 MG tablet Take 30 mg by mouth daily as needed.       Marland Kitchen Ketotifen Fumarate (ZADITOR OP) Apply to eye.        Bertram Gala Glycol-Propyl Glycol (SYSTANE) 0.4-0.3 % SOLN Apply 1 drop to eye as needed.             Review of Systems Review of Systems  Constitutional: Negative for fever, appetite change, fatigue and unexpected weight change.  Eyes: Negative for pain and visual disturbance.  Respiratory: Negative for cough and shortness of breath.   Cardiovascular: Negative.  for cp or palp Gastrointestinal: Negative for nausea, diarrhea and constipation.  Genitourinary: Negative for urgency and frequency.  Skin: Negative for pallor. pos for bug bite / no rash Neurological: Negative for weakness, light-headedness, numbness and headaches.  Hematological: Negative for adenopathy. Does not bruise/bleed easily.  Psychiatric/Behavioral: Negative for dysphoric mood. The patient is not nervous/anxious.          Objective:   Physical Exam  Constitutional: She appears well-developed and well-nourished. No distress.        overwt and well appearing   HENT:  Head: Normocephalic and atraumatic.  Mouth/Throat: Oropharynx is clear and moist.  Eyes: Conjunctivae and EOM are normal. Pupils are equal, round, and reactive to light.  Neck: Normal range of motion. Neck supple. No JVD present. No thyromegaly present.  Cardiovascular: Normal rate, regular rhythm, normal heart sounds and intact distal pulses.   Pulmonary/Chest: Effort normal and breath sounds normal. No respiratory distress. She has no wheezes.  Abdominal: Soft. Bowel sounds are normal. She exhibits no distension and no mass. There is no tenderness.  Musculoskeletal: She exhibits no edema.  Lymphadenopathy:    She has no cervical adenopathy.  Neurological: She is alert. She has normal reflexes.  Skin: Skin is warm and dry. No rash noted. No pallor.       1 cm area of induration/ erythema L arm- resembles insect bite           Assessment & Plan:

## 2010-09-16 NOTE — Assessment & Plan Note (Signed)
Much improved on omeprazole and lifestyle change Disc effort to get more fluids  Will continue daily med for 3 mo then cut to qod if tolerated F/u 6 months Also working on wt loss and exercise -commended

## 2010-09-16 NOTE — Patient Instructions (Signed)
Take the omeprazole daily for 3 months  Then try to take it every other day -- if symptoms reoccur then go back to daily  Follow up in 6 months  Spot on arm looks like allergic reaction -- cortaid (otc cortisone cream)= if it worsens let me know

## 2010-09-19 DIAGNOSIS — W57XXXA Bitten or stung by nonvenomous insect and other nonvenomous arthropods, initial encounter: Secondary | ICD-10-CM | POA: Insufficient documentation

## 2010-09-19 NOTE — Assessment & Plan Note (Signed)
On arm - uncomplicated  Recommend otc cortisone cream Update if worse or not imp

## 2010-12-11 ENCOUNTER — Other Ambulatory Visit: Payer: Self-pay | Admitting: *Deleted

## 2010-12-11 NOTE — Telephone Encounter (Signed)
Done and in IN box 

## 2010-12-11 NOTE — Telephone Encounter (Signed)
Prior auth is needed for omeprazole, form is on your shelf. 

## 2010-12-11 NOTE — Telephone Encounter (Signed)
Form faxed to [facility].

## 2010-12-12 MED ORDER — OMEPRAZOLE 20 MG PO CPDR: 20.0000 mg | DELAYED_RELEASE_CAPSULE | Freq: Every day | ORAL | Status: DC

## 2010-12-12 NOTE — Telephone Encounter (Signed)
Prior auth given, advised pharmacy.  Approval letter placed on doctor's desk for signature and scanning.

## 2011-02-28 ENCOUNTER — Telehealth: Payer: Self-pay | Admitting: Internal Medicine

## 2011-02-28 NOTE — Telephone Encounter (Signed)
Called patient and provided information Dr. Milinda Antis provided.

## 2011-02-28 NOTE — Telephone Encounter (Signed)
Patient called and stated her GYN doctor retired and would like a referral to a GYN doctor.  She wanted your input on which doctor you prefer.

## 2011-02-28 NOTE — Telephone Encounter (Signed)
[facility] like [facility] [place] -- I like all the docs there including Dr Vickey Sages and Rana Snare and Vincente Poli (I'm not sure who is taking new patients) If she needs to stay closer to here - the [facility] across [address] is good  Let me know if she needs a ref

## 2011-03-04 ENCOUNTER — Other Ambulatory Visit (INDEPENDENT_AMBULATORY_CARE_PROVIDER_SITE_OTHER): Payer: Self-pay | Admitting: General Surgery

## 2011-03-04 DIAGNOSIS — Z1231 Encounter for screening mammogram for malignant neoplasm of breast: Secondary | ICD-10-CM

## 2011-03-20 ENCOUNTER — Encounter (INDEPENDENT_AMBULATORY_CARE_PROVIDER_SITE_OTHER): Payer: Self-pay | Admitting: General Surgery

## 2011-03-26 ENCOUNTER — Ambulatory Visit
Admission: RE | Admit: 2011-03-26 | Discharge: 2011-03-26 | Disposition: A | Discharge status: Home / Self Care | Payer: MEDICARE | Source: Ambulatory Visit | Attending: General Surgery | Admitting: General Surgery

## 2011-03-26 DIAGNOSIS — Z1231 Encounter for screening mammogram for malignant neoplasm of breast: Secondary | ICD-10-CM

## 2011-03-27 ENCOUNTER — Telehealth: Payer: Self-pay | Admitting: Family Medicine

## 2011-03-27 MED ORDER — SIMVASTATIN 10 MG PO TABS: 10.0000 mg | ORAL_TABLET | Freq: Every day | ORAL | Status: DC

## 2011-03-27 NOTE — Telephone Encounter (Signed)
Requesting Simvastatin refills today.  Patient is going out of town tomorrow.  Call back is [phone]

## 2011-03-27 NOTE — Telephone Encounter (Signed)
Pt request refill Simvastatin 10 mg #30 x 11. Sent to [facility] electronically and pt notified med sent in.

## 2011-05-06 ENCOUNTER — Other Ambulatory Visit (HOSPITAL_COMMUNITY)
Admission: RE | Admit: 2011-05-06 | Discharge: 2011-05-06 | Disposition: A | Discharge status: Home / Self Care | Payer: Medicare Other | Source: Ambulatory Visit | Attending: Family Medicine | Admitting: Family Medicine

## 2011-05-06 ENCOUNTER — Ambulatory Visit (INDEPENDENT_AMBULATORY_CARE_PROVIDER_SITE_OTHER): Payer: BC Managed Care – PPO | Admitting: Family Medicine

## 2011-05-06 ENCOUNTER — Encounter: Payer: Self-pay | Admitting: Family Medicine

## 2011-05-06 DIAGNOSIS — Z8742 Personal history of other diseases of the female genital tract: Secondary | ICD-10-CM

## 2011-05-06 DIAGNOSIS — Z124 Encounter for screening for malignant neoplasm of cervix: Secondary | ICD-10-CM | POA: Insufficient documentation

## 2011-05-06 DIAGNOSIS — Z87898 Personal history of other specified conditions: Secondary | ICD-10-CM | POA: Insufficient documentation

## 2011-05-06 NOTE — Patient Instructions (Signed)
Preventive Care for Adults, Female A healthy lifestyle and preventive care can promote health and wellness. Preventive health guidelines for women include the following key practices.  A routine yearly physical is a good way to check with your caregiver about your health and preventive screening. It is a chance to share any concerns and updates on your health, and to receive a thorough exam.   Visit your dentist for a routine exam and preventive care every 6 months. Brush your teeth twice a day and floss once a day. Good oral hygiene prevents tooth decay and gum disease.   The frequency of eye exams is based on your age, health, family medical history, use of contact lenses, and other factors. Follow your caregiver's recommendations for frequency of eye exams.   Eat a healthy diet. Foods like vegetables, fruits, whole grains, low-fat dairy products, and lean protein foods contain the nutrients you need without too many calories. Decrease your intake of foods high in solid fats, added sugars, and salt. Eat the right amount of calories for you.Get information about a proper diet from your caregiver, if necessary.   Regular physical exercise is one of the most important things you can do for your health. Most adults should get at least 150 minutes of moderate-intensity exercise (any activity that increases your heart rate and causes you to sweat) each week. In addition, most adults need muscle-strengthening exercises on 2 or more days a week.   Maintain a healthy weight. The body mass index (BMI) is a screening tool to identify possible weight problems. It provides an estimate of body fat based on height and weight. Your caregiver can help determine your BMI, and can help you achieve or maintain a healthy weight.For adults 20 years and older:   A BMI below 18.5 is considered underweight.   A BMI of 18.5 to 24.9 is normal.   A BMI of 25 to 29.9 is considered overweight.   A BMI of 30 and above is  considered obese.   Maintain normal blood lipids and cholesterol levels by exercising and minimizing your intake of saturated fat. Eat a balanced diet with plenty of fruit and vegetables. Blood tests for lipids and cholesterol should begin at age 20 and be repeated every 5 years. If your lipid or cholesterol levels are high, you are over 50, or you are at high risk for heart disease, you may need your cholesterol levels checked more frequently.Ongoing high lipid and cholesterol levels should be treated with medicines if diet and exercise are not effective.   If you smoke, find out from your caregiver how to quit. If you do not use tobacco, do not start.   If you are pregnant, do not drink alcohol. If you are breastfeeding, be very cautious about drinking alcohol. If you are not pregnant and choose to drink alcohol, do not exceed 1 drink per day. One drink is considered to be 12 ounces (355 mL) of beer, 5 ounces (148 mL) of wine, or 1.5 ounces (44 mL) of liquor.   Avoid use of street drugs. Do not share needles with anyone. Ask for help if you need support or instructions about stopping the use of drugs.   High blood pressure causes heart disease and increases the risk of stroke. Your blood pressure should be checked at least every 1 to 2 years. Ongoing high blood pressure should be treated with medicines if weight loss and exercise are not effective.   If you are 55 to 66   years old, ask your caregiver if you should take aspirin to prevent strokes.   Diabetes screening involves taking a blood sample to check your fasting blood sugar level. This should be done once every 3 years, after age 45, if you are within normal weight and without risk factors for diabetes. Testing should be considered at a younger age or be carried out more frequently if you are overweight and have at least 1 risk factor for diabetes.   Breast cancer screening is essential preventive care for women. You should practice "breast  self-awareness." This means understanding the normal appearance and feel of your breasts and may include breast self-examination. Any changes detected, no matter how small, should be reported to a caregiver. Women in their 20s and 30s should have a clinical breast exam (CBE) by a caregiver as part of a regular health exam every 1 to 3 years. After age 40, women should have a CBE every year. Starting at age 40, women should consider having a mammography (breast X-ray test) every year. Women who have a family history of breast cancer should talk to their caregiver about genetic screening. Women at a high risk of breast cancer should talk to their caregivers about having magnetic resonance imaging (MRI) and a mammography every year.   The Pap test is a screening test for cervical cancer. A Pap test can show cell changes on the cervix that might become cervical cancer if left untreated. A Pap test is a procedure in which cells are obtained and examined from the lower end of the uterus (cervix).   Women should have a Pap test starting at age 21.   Between ages 21 and 29, Pap tests should be repeated every 2 years.   Beginning at age 30, you should have a Pap test every 3 years as long as the past 3 Pap tests have been normal.   Some women have medical problems that increase the chance of getting cervical cancer. Talk to your caregiver about these problems. It is especially important to talk to your caregiver if a new problem develops soon after your last Pap test. In these cases, your caregiver may recommend more frequent screening and Pap tests.   The above recommendations are the same for women who have or have not gotten the vaccine for human papillomavirus (HPV).   If you had a hysterectomy for a problem that was not cancer or a condition that could lead to cancer, then you no longer need Pap tests. Even if you no longer need a Pap test, a regular exam is a good idea to make sure no other problems are  starting.   If you are between ages 65 and 70, and you have had normal Pap tests going back 10 years, you no longer need Pap tests. Even if you no longer need a Pap test, a regular exam is a good idea to make sure no other problems are starting.   If you have had past treatment for cervical cancer or a condition that could lead to cancer, you need Pap tests and screening for cancer for at least 20 years after your treatment.   If Pap tests have been discontinued, risk factors (such as a new sexual partner) need to be reassessed to determine if screening should be resumed.   The HPV test is an additional test that may be used for cervical cancer screening. The HPV test looks for the virus that can cause the cell changes on the cervix.   The cells collected during the Pap test can be tested for HPV. The HPV test could be used to screen women aged 30 years and older, and should be used in women of any age who have unclear Pap test results. After the age of 30, women should have HPV testing at the same frequency as a Pap test.   Colorectal cancer can be detected and often prevented. Most routine colorectal cancer screening begins at the age of 50 and continues through age 75. However, your caregiver may recommend screening at an earlier age if you have risk factors for colon cancer. On a yearly basis, your caregiver may provide home test kits to check for hidden blood in the stool. Use of a small camera at the end of a tube, to directly examine the colon (sigmoidoscopy or colonoscopy), can detect the earliest forms of colorectal cancer. Talk to your caregiver about this at age 50, when routine screening begins. Direct examination of the colon should be repeated every 5 to 10 years through age 75, unless early forms of pre-cancerous polyps or small growths are found.   Hepatitis C blood testing is recommended for all people born from 1945 through 1965 and any individual with known risks for hepatitis C.    Practice safe sex. Use condoms and avoid high-risk sexual practices to reduce the spread of sexually transmitted infections (STIs). STIs include gonorrhea, chlamydia, syphilis, trichomonas, herpes, HPV, and human immunodeficiency virus (HIV). Herpes, HIV, and HPV are viral illnesses that have no cure. They can result in disability, cancer, and death. Sexually active women aged 25 and younger should be checked for chlamydia. Older women with new or multiple partners should also be tested for chlamydia. Testing for other STIs is recommended if you are sexually active and at increased risk.   Osteoporosis is a disease in which the bones lose minerals and strength with aging. This can result in serious bone fractures. The risk of osteoporosis can be identified using a bone density scan. Women ages 65 and over and women at risk for fractures or osteoporosis should discuss screening with their caregivers. Ask your caregiver whether you should take a calcium supplement or vitamin D to reduce the rate of osteoporosis.   Menopause can be associated with physical symptoms and risks. Hormone replacement therapy is available to decrease symptoms and risks. You should talk to your caregiver about whether hormone replacement therapy is right for you.   Use sunscreen with sun protection factor (SPF) of 30 or more. Apply sunscreen liberally and repeatedly throughout the day. You should seek shade when your shadow is shorter than you. Protect yourself by wearing long sleeves, pants, a wide-brimmed hat, and sunglasses year round, whenever you are outdoors.   Once a month, do a whole body skin exam, using a mirror to look at the skin on your back. Notify your caregiver of new moles, moles that have irregular borders, moles that are larger than a pencil eraser, or moles that have changed in shape or color.   Stay current with required immunizations.   Influenza. You need a dose every fall (or winter). The composition of  the flu vaccine changes each year, so being vaccinated once is not enough.   Pneumococcal polysaccharide. You need 1 to 2 doses if you smoke cigarettes or if you have certain chronic medical conditions. You need 1 dose at age 65 (or older) if you have never been vaccinated.   Tetanus, diphtheria, pertussis (Tdap, Td). Get 1 dose of   Tdap vaccine if you are younger than age 65, are over 65 and have contact with an infant, are a healthcare worker, are pregnant, or simply want to be protected from whooping cough. After that, you need a Td booster dose every 10 years. Consult your caregiver if you have not had at least 3 tetanus and diphtheria-containing shots sometime in your life or have a deep or dirty wound.   HPV. You need this vaccine if you are a woman age 26 or younger. The vaccine is given in 3 doses over 6 months.   Measles, mumps, rubella (MMR). You need at least 1 dose of MMR if you were born in 1957 or later. You may also need a second dose.   Meningococcal. If you are age 19 to 21 and a first-year college student living in a residence hall, or have one of several medical conditions, you need to get vaccinated against meningococcal disease. You may also need additional booster doses.   Zoster (shingles). If you are age 60 or older, you should get this vaccine.   Varicella (chickenpox). If you have never had chickenpox or you were vaccinated but received only 1 dose, talk to your caregiver to find out if you need this vaccine.   Hepatitis A. You need this vaccine if you have a specific risk factor for hepatitis A virus infection or you simply wish to be protected from this disease. The vaccine is usually given as 2 doses, 6 to 18 months apart.   Hepatitis B. You need this vaccine if you have a specific risk factor for hepatitis B virus infection or you simply wish to be protected from this disease. The vaccine is given in 3 doses, usually over 6 months.  Preventive Services /  Frequency Ages 19 to 39  Blood pressure check.** / Every 1 to 2 years.   Lipid and cholesterol check.** / Every 5 years beginning at age 20.   Clinical breast exam.** / Every 3 years for women in their 20s and 30s.   Pap test.** / Every 2 years from ages 21 through 29. Every 3 years starting at age 30 through age 65 or 70 with a history of 3 consecutive normal Pap tests.   HPV screening.** / Every 3 years from ages 30 through ages 65 to 70 with a history of 3 consecutive normal Pap tests.   Hepatitis C blood test.** / For any individual with known risks for hepatitis C.   Skin self-exam. / Monthly.   Influenza immunization.** / Every year.   Pneumococcal polysaccharide immunization.** / 1 to 2 doses if you smoke cigarettes or if you have certain chronic medical conditions.   Tetanus, diphtheria, pertussis (Tdap, Td) immunization. / A one-time dose of Tdap vaccine. After that, you need a Td booster dose every 10 years.   HPV immunization. / 3 doses over 6 months, if you are 26 and younger.   Measles, mumps, rubella (MMR) immunization. / You need at least 1 dose of MMR if you were born in 1957 or later. You may also need a second dose.   Meningococcal immunization. / 1 dose if you are age 19 to 21 and a first-year college student living in a residence hall, or have one of several medical conditions, you need to get vaccinated against meningococcal disease. You may also need additional booster doses.   Varicella immunization.** / Consult your caregiver.   Hepatitis A immunization.** / Consult your caregiver. 2 doses, 6 to 18 months   apart.   Hepatitis B immunization.** / Consult your caregiver. 3 doses usually over 6 months.  Ages 40 to 64  Blood pressure check.** / Every 1 to 2 years.   Lipid and cholesterol check.** / Every 5 years beginning at age 20.   Clinical breast exam.** / Every year after age 40.   Mammogram.** / Every year beginning at age 40 and continuing for as  long as you are in good health. Consult with your caregiver.   Pap test.** / Every 3 years starting at age 30 through age 65 or 70 with a history of 3 consecutive normal Pap tests.   HPV screening.** / Every 3 years from ages 30 through ages 65 to 70 with a history of 3 consecutive normal Pap tests.   Fecal occult blood test (FOBT) of stool. / Every year beginning at age 50 and continuing until age 75. You may not need to do this test if you get a colonoscopy every 10 years.   Flexible sigmoidoscopy or colonoscopy.** / Every 5 years for a flexible sigmoidoscopy or every 10 years for a colonoscopy beginning at age 50 and continuing until age 75.   Hepatitis C blood test.** / For all people born from 1945 through 1965 and any individual with known risks for hepatitis C.   Skin self-exam. / Monthly.   Influenza immunization.** / Every year.   Pneumococcal polysaccharide immunization.** / 1 to 2 doses if you smoke cigarettes or if you have certain chronic medical conditions.   Tetanus, diphtheria, pertussis (Tdap, Td) immunization.** / A one-time dose of Tdap vaccine. After that, you need a Td booster dose every 10 years.   Measles, mumps, rubella (MMR) immunization. / You need at least 1 dose of MMR if you were born in 1957 or later. You may also need a second dose.   Varicella immunization.** / Consult your caregiver.   Meningococcal immunization.** / Consult your caregiver.   Hepatitis A immunization.** / Consult your caregiver. 2 doses, 6 to 18 months apart.   Hepatitis B immunization.** / Consult your caregiver. 3 doses, usually over 6 months.  Ages 65 and over  Blood pressure check.** / Every 1 to 2 years.   Lipid and cholesterol check.** / Every 5 years beginning at age 20.   Clinical breast exam.** / Every year after age 40.   Mammogram.** / Every year beginning at age 40 and continuing for as long as you are in good health. Consult with your caregiver.   Pap test.** /  Every 3 years starting at age 30 through age 65 or 70 with a 3 consecutive normal Pap tests. Testing can be stopped between 65 and 70 with 3 consecutive normal Pap tests and no abnormal Pap or HPV tests in the past 10 years.   HPV screening.** / Every 3 years from ages 30 through ages 65 or 70 with a history of 3 consecutive normal Pap tests. Testing can be stopped between 65 and 70 with 3 consecutive normal Pap tests and no abnormal Pap or HPV tests in the past 10 years.   Fecal occult blood test (FOBT) of stool. / Every year beginning at age 50 and continuing until age 75. You may not need to do this test if you get a colonoscopy every 10 years.   Flexible sigmoidoscopy or colonoscopy.** / Every 5 years for a flexible sigmoidoscopy or every 10 years for a colonoscopy beginning at age 50 and continuing until age 75.   Hepatitis   C blood test.** / For all people born from 1945 through 1965 and any individual with known risks for hepatitis C.   Osteoporosis screening.** / A one-time screening for women ages 65 and over and women at risk for fractures or osteoporosis.   Skin self-exam. / Monthly.   Influenza immunization.** / Every year.   Pneumococcal polysaccharide immunization.** / 1 dose at age 65 (or older) if you have never been vaccinated.   Tetanus, diphtheria, pertussis (Tdap, Td) immunization. / A one-time dose of Tdap vaccine if you are over 65 and have contact with an infant, are a healthcare worker, or simply want to be protected from whooping cough. After that, you need a Td booster dose every 10 years.   Varicella immunization.** / Consult your caregiver.   Meningococcal immunization.** / Consult your caregiver.   Hepatitis A immunization.** / Consult your caregiver. 2 doses, 6 to 18 months apart.   Hepatitis B immunization.** / Check with your caregiver. 3 doses, usually over 6 months.  ** Family history and personal history of risk and conditions may change your caregiver's  recommendations. Document Released: 04/08/2001 Document Revised: 01/30/2011 Document Reviewed: 07/08/2010 [employer] Patient Information 2012 [employer]. 

## 2011-05-06 NOTE — Progress Notes (Signed)
SUBJECTIVE:  66 y.o. female for annual routine Pap and checkup.  Has yearly exams with Dr. Milinda Antis.  Has had an abd hyst and BSO some time ago with Dr. Randell Patient.  Is not sexually active. No problems today.  Current Outpatient Prescriptions  Medication Sig Dispense Refill  . aspirin 81 MG tablet Take 81 mg by mouth daily.        . Calcium Carbonate (CALTRATE 600 PO) Take 1 tablet by mouth daily.       . fexofenadine (ALLEGRA) 30 MG tablet Take 30 mg by mouth daily as needed.       Marland Kitchen GLUCOSAMINE HCL PO Take 1 tablet by mouth daily.       Marland Kitchen Ketotifen Fumarate (ZADITOR OP) Apply to eye.        . levothyroxine (SYNTHROID, LEVOTHROID) 88 MCG tablet Take 88 mcg by mouth daily.        . Multiple Vitamin (MULTIVITAMIN) capsule Take 1 capsule by mouth daily.        . OMEGA 3 1000 MG CAPS Take 1 capsule by mouth daily.       Marland Kitchen omeprazole (PRILOSEC) 20 MG capsule Take 1 capsule (20 mg total) by mouth daily.  30 capsule  11  . Polyethyl Glycol-Propyl Glycol (SYSTANE) 0.4-0.3 % SOLN Apply 1 drop to eye as needed.        . simvastatin (ZOCOR) 10 MG tablet Take 1 tablet (10 mg total) by mouth at bedtime.  30 tablet  11  . fluticasone (FLONASE) 50 MCG/ACT nasal spray 1 spray by Nasal route daily as needed.         Allergies: Adhesive; Ciprofloxacin; Flagyl; Latex; Metronidazole; Sulfa drugs cross reactors; and Sulfonamide derivatives  No LMP recorded. Patient has had a hysterectomy. Past Medical History  Diagnosis Date  . Thyroid disease   . Diverticulosis   . Hyperlipidemia   . 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 Fibrocystic breast   . Allergy   . Nephrolithiasis     Hx of  . Abnormal Pap smear     ASCUS   Past Surgical History  Procedure Date  . Tonsillectomy 1968  . Thyroidectomy 1972  . Abdominal hysterectomy 2005  . Bunionectomy 2010  . Laparoscopy     D&C  . Dilation and curettage of uterus   . Tubal ligation   . Breast cyst aspiration 06/2003    and 04/2004  . Colonoscopy 2006  .  Bilateral salpingoophorectomy 2005   Family History  Problem Relation Age of Onset  . Cancer Father     lung CA  . Cancer Sister     melanoma  . Stroke Mother    History   Social History  . Marital Status: Married    Spouse Name: N/A    Number of Children: N/A  . Years of Education: N/A   Occupational History  . Not on file.   Social History Main Topics  . Smoking status: Never Smoker   . Smokeless tobacco: Not on file  . Alcohol Use: Yes     2-3 GLASSES OF WINE A WEEK  . Drug Use: No  . Sexually Active: No   Other Topics Concern  . Not on file   Social History Narrative  . No narrative on file    ROS:  Feeling well. No dyspnea or chest pain on exertion.  No abdominal pain, change in bowel habits, black or bloody stools.  No urinary tract symptoms. GYN ROS:  no hot flashes. No neurological complaints.  OBJECTIVE:  The patient appears well, alert, oriented x 3, in no distress. BP 126/69  Pulse 80  Ht 5\' 4"  (1.626 m)  Wt 165 lb (74.844 kg)  BMI 28.32 kg/m2 Physical Examination: General appearance - alert, well appearing, and in no distress Musculoskeletal - no joint tenderness, deformity or swelling Extremities - peripheral pulses normal, no pedal edema, no clubbing or cyanosis Skin - normal coloration and turgor, no rashes, no suspicious skin lesions noted ENT normal.  Neck supple. No adenopathy or thyromegaly. PERLA. Lungs are clear, good air entry, no wheezes, rhonchi or rales. S1 and S2 normal, no murmurs, regular rate and rhythm. Abdomen soft without tenderness, guarding, mass or organomegaly. Extremities show no edema, normal peripheral pulses. Neurological is normal, no focal findings.  BREAST EXAM: breasts appear normal, no suspicious masses, no skin or nipple changes or axillary nodes  PELVIC EXAM: VULVA: vulvar hypopigmentation at urethral meatus, VAGINA: normal appearing vagina with normal color and discharge, no lesions, CERVIX: normal appearing cervix  without discharge or lesions, uterus and adnexa are surgically absent.  ASSESSMENT:  Postmenopausal female H/o abnormal pap  PLAN:  Mammogram done 02/2011 pap smear return annually or prn

## 2011-05-15 ENCOUNTER — Ambulatory Visit (INDEPENDENT_AMBULATORY_CARE_PROVIDER_SITE_OTHER): Payer: MEDICARE | Admitting: General Surgery

## 2011-05-15 ENCOUNTER — Encounter (INDEPENDENT_AMBULATORY_CARE_PROVIDER_SITE_OTHER): Payer: Self-pay | Admitting: General Surgery

## 2011-05-15 VITALS — BP 122/84 | HR 68 | Temp 98.2°F | Resp 14 | Ht 64.0 in | Wt 167.1 lb

## 2011-05-15 DIAGNOSIS — N6019 Diffuse cystic mastopathy of unspecified breast: Secondary | ICD-10-CM

## 2011-05-15 NOTE — Progress Notes (Signed)
Chief complaint: Followup fibrocystic disease and breast mass.  History: Patient returns for long-term followup for fibrocystic breast disease with multiple breast cysts and masses in the past. We have followed a small palpable mass in the lateral left breast for 2 years which on MRI and ultrasound has shown a small cyst containing apparently thick fluid. She reports no problems since her last visit. A small palpable area remains and fluctuates slightly in size but overall has not grown. She has some occasional discomfort in her upper left breast. No other palpable areas, discharge, skin changes or other complaints.  Exam: General: Appears well Lymph nodes: No palpable cervical, supraclavicular, or axillary nodes Breasts: Again noted in the lateral left breast beneath a previous incision is a superficial approximately 1 cm soft cystic feeling mass. This is unchanged. No other palpable abnormalities in either breast. No skin changes.  Imaging: Mammogram 2 weeks ago was negative.  Assessment and plan: Fibrocystic breast disease with no new worrisome findings a history, exam or imaging. Return in one year.

## 2012-01-21 ENCOUNTER — Emergency Department: Payer: Self-pay | Admitting: Emergency Medicine

## 2012-01-21 ENCOUNTER — Telehealth: Payer: Self-pay | Admitting: Family Medicine

## 2012-01-21 LAB — COMPREHENSIVE METABOLIC PANEL
Albumin: 4.1 g/dL (ref 3.4–5.0)
Anion Gap: 6 — ABNORMAL LOW (ref 7–16)
BUN: 12 mg/dL (ref 7–18)
Bilirubin,Total: 0.4 mg/dL (ref 0.2–1.0)
Chloride: 106 mmol/L (ref 98–107)
Co2: 28 mmol/L (ref 21–32)
Creatinine: 0.66 mg/dL (ref 0.60–1.30)
EGFR (African American): >60
Glucose: 99 mg/dL (ref 65–99)
Osmolality: 279 (ref 275–301)
Potassium: 3.9 mmol/L (ref 3.5–5.1)
SGOT(AST): 33 U/L (ref 15–37)
SGPT (ALT): 43 U/L (ref 12–78)
Sodium: 140 mmol/L (ref 136–145)
Total Protein: 7.2 g/dL (ref 6.4–8.2)

## 2012-01-21 LAB — CK TOTAL AND CKMB (NOT AT ARMC)
CK, Total: 72 U/L (ref 21–215)
CK-MB: <0.5 ng/mL — ABNORMAL LOW (ref 0.5–3.6)

## 2012-01-21 LAB — CBC
HCT: 42.1 % (ref 35.0–47.0)
Platelet: 111 10*3/uL — ABNORMAL LOW (ref 150–440)
RBC: 4.48 10*6/uL (ref 3.80–5.20)
RDW: 12 % (ref 11.5–14.5)
WBC: 6.8 10*3/uL (ref 3.6–11.0)

## 2012-01-21 LAB — TROPONIN I
Troponin-I: <0.02 ng/mL
Troponin-I: <0.02 ng/mL

## 2012-01-21 NOTE — Telephone Encounter (Signed)
Patient Information:  Caller Name: [place]  Phone: [phone]  Patient: Nancy [name], Nancy [name]  Gender: Female  DOB: 04-26-1945  Age: 66 Years  PCP: Roxy Manns [facility])   Symptoms  Reason For Call & Symptoms: chest pain with belching, but onset of worsening "tightening" of chest and belching AM 01/21/12.  Reviewed Health History In EMR: Yes  Reviewed Medications In EMR: Yes  Reviewed Allergies In EMR: Yes  Reviewed Surgeries / Procedures: Yes  Date of Onset of Symptoms: 01/20/2012  Guideline(s) Used:  Chest Pain  Disposition Per Guideline:   Go to ED Now (or to Office with PCP Approval)  Reason For Disposition Reached:   Chest pain lasting longer than 5 minutes  Advice Given:  N/A  Office Follow Up:  Does the office need to follow up with this patient?: No  Instructions For The Office: N/A  RN Overrode Recommendation:  Go To ED  chest pain > 5 min  RN Note:  Patient to go to ED; agrees.  Will go to [facility].  krs/can

## 2012-01-21 NOTE — Telephone Encounter (Signed)
Agree with that recommendation to go to ER

## 2012-02-03 ENCOUNTER — Ambulatory Visit (INDEPENDENT_AMBULATORY_CARE_PROVIDER_SITE_OTHER): Payer: Medicare Other | Admitting: Family Medicine

## 2012-02-03 ENCOUNTER — Encounter: Payer: Self-pay | Admitting: Family Medicine

## 2012-02-03 VITALS — BP 118/74 | HR 78 | Temp 97.7°F | Ht 64.0 in | Wt 165.8 lb

## 2012-02-03 DIAGNOSIS — K219 Gastro-esophageal reflux disease without esophagitis: Secondary | ICD-10-CM

## 2012-02-03 MED ORDER — OMEPRAZOLE 40 MG PO CPDR: 40.0000 mg | DELAYED_RELEASE_CAPSULE | Freq: Every day | ORAL | Status: DC

## 2012-02-03 NOTE — Progress Notes (Signed)
Subjective:    Patient ID: Nancy [name], female    DOB: [birth year], 66 y.o.   MRN: [id number]  HPI Here for f/u after ER visit [facility] day after thanksgiving - GERD  Before that she had worn a tight cami  Got some tightness in chest- took it off  Was very burpy- and that persisted for the next few days  She called the office with chest pain   She was inst to go to the ER Went to [facility]  EKG and blood test / cxr  All was normal incl her vitals   Dx with increased reflux  Was given px for omeprazole  They increased her dose to 40 mg qd (had been trying to wean it)  Originally when dx - was adv to cut out her ice tea  Has cut way back on that  occ coffee 1 cup in am 1/2 decaf and dilluted with milk  Head of her bed is elevated Cut down on chocolate and spicy food a lot  occ fatty foods  No more fast foods    Patient Active Problem List  Diagnosis  . HYPOTHYROIDISM  . HYPERLIPIDEMIA  . DIVERTICULOSIS, COLON  . RENAL CALCULUS  . FIBROCYSTIC BREAST DISEASE  . ROSACEA  . DEGENERATIVE DISC DISEASE  . BACK PAIN  . HYPERGLYCEMIA, FASTING  . ALLERGY  . NEPHROLITHIASIS, HX OF  . POSTMENOPAUSAL STATUS  . GERD (gastroesophageal reflux disease)  . H/O abnormal Pap smear   Past Medical History  Diagnosis Date  . Thyroid disease   . Diverticulosis   . Hyperlipidemia   . 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 Fibrocystic breast   . Allergy   . Nephrolithiasis     Hx of  . Abnormal Pap smear     ASCUS  . GERD (gastroesophageal reflux disease)    Past Surgical History  Procedure Date  . Tonsillectomy 1968  . Thyroidectomy 1972  . Abdominal hysterectomy 2005  . Bunionectomy 2010  . Laparoscopy     D&C  . Dilation and curettage of uterus   . Tubal ligation   . Breast cyst aspiration 06/2003    and 04/2004  . Colonoscopy 2006  . Bilateral salpingoophorectomy 2005   History  Substance Use Topics  . Smoking status: Never Smoker   . Smokeless tobacco: Never Used  .  Alcohol Use: 0.0 oz/week    2-3 Glasses of wine per week     Comment: 2-3 GLASSES OF WINE A WEEK   Family History  Problem Relation Age of Onset  . Cancer Father     lung CA  . Cancer Sister     melanoma  . Stroke Mother    Allergies  Allergen Reactions  . Adhesive (Tape)   . Ciprofloxacin     REACTION: rash  . Flagyl (Metronidazole Hcl)   . Latex     REACTION: rash  . Metronidazole   . Sulfa Drugs Cross Reactors   . Sulfonamide Derivatives     REACTION: rash   Current Outpatient Prescriptions on File Prior to Visit  Medication Sig Dispense Refill  . aspirin 81 MG tablet Take 81 mg by mouth daily.        . Calcium Carbonate (CALTRATE 600 PO) Take 1 tablet by mouth daily.       . fexofenadine (ALLEGRA) 30 MG tablet Take 30 mg by mouth daily as needed.       . fluticasone (FLONASE) 50 MCG/ACT nasal  spray 1 spray by Nasal route daily as needed.        Marland Kitchen GLUCOSAMINE HCL PO Take 1 tablet by mouth daily.       Marland Kitchen Ketotifen Fumarate (ZADITOR OP) Apply to eye.        . levothyroxine (SYNTHROID, LEVOTHROID) 88 MCG tablet Take 88 mcg by mouth daily.        . LUTEIN PO Take by mouth daily.      . Multiple Vitamin (MULTIVITAMIN) capsule Take 1 capsule by mouth daily.        . Multiple Vitamins-Minerals (CENTRUM PO) Take by mouth daily.      Bertram Gala Glycol-Propyl Glycol (SYSTANE) 0.4-0.3 % SOLN Apply 1 drop to eye as needed.        . simvastatin (ZOCOR) 10 MG tablet Take 1 tablet (10 mg total) by mouth at bedtime.  30 tablet  11  . omeprazole (PRILOSEC) 20 MG capsule Take 20 mg by mouth every other day.          Review of Systems Review of Systems  Constitutional: Negative for fever, appetite change, fatigue and unexpected weight change.  Eyes: Negative for pain and visual disturbance.  Respiratory: Negative for cough and shortness of breath.   Cardiovascular: Negative for cp or palpitations   chest pressure is resolved  Gastrointestinal: Negative for nausea, diarrhea and  constipation. pos for heartburn that is now resolved  Genitourinary: Negative for urgency and frequency.  Skin: Negative for pallor or rash   Neurological: Negative for weakness, light-headedness, numbness and headaches.  Hematological: Negative for adenopathy. Does not bruise/bleed easily.  Psychiatric/Behavioral: Negative for dysphoric mood. The patient is not nervous/anxious.         Objective:   Physical Exam  Constitutional: She appears well-developed and well-nourished. No distress.       overwt and well appearing   HENT:  Head: Normocephalic.  Mouth/Throat: Oropharynx is clear and moist.  Eyes: Conjunctivae normal and EOM are normal. Pupils are equal, round, and reactive to light. No scleral icterus.  Neck: Normal range of motion. Neck supple. No JVD present. Carotid bruit is not present. No thyromegaly present.  Cardiovascular: Normal rate, regular rhythm, normal heart sounds and intact distal pulses.  Exam reveals no gallop.   Pulmonary/Chest: Effort normal and breath sounds normal. No respiratory distress. She has no wheezes. She exhibits no tenderness.  Abdominal: Soft. Bowel sounds are normal. She exhibits no distension, no abdominal bruit and no mass. There is no tenderness. There is no rebound and no guarding.  Musculoskeletal: She exhibits no edema and no tenderness.  Lymphadenopathy:    She has no cervical adenopathy.  Neurological: She is alert. She has normal reflexes. No cranial nerve deficit. She exhibits normal muscle tone. Coordination normal.  Skin: Skin is warm and dry. No pallor.  Psychiatric: She has a normal mood and affect.          Assessment & Plan:

## 2012-02-03 NOTE — Assessment & Plan Note (Signed)
Pt had exacerbation now controlled on omeprazole 40 See AVS for inst Wean to 20 in 2 weeks and see how she does if possible Continue good diet Sent for ER records

## 2012-02-03 NOTE — Patient Instructions (Addendum)
Continue the omeprazole at 40 mg for now  In 2 weeks - try to drop back down to the 20 mg daily (if your reflux comes back - then stick to the 40 mg) We will not try to wean any further than there  Keep up the healthy diet  Please keep me updated  Please sign a release up front to send for your ER records from [facility]

## 2012-02-10 ENCOUNTER — Telehealth: Payer: Self-pay | Admitting: Family Medicine

## 2012-02-10 NOTE — Telephone Encounter (Signed)
Pt called asking if [facility] had sent over reports from her ED visit.  Informed pt that report had been sent to [place] on 02/08/12 and that it is in her chart.

## 2012-02-12 ENCOUNTER — Telehealth: Payer: Self-pay

## 2012-02-12 NOTE — Telephone Encounter (Signed)
[facility] faxed PA for Omeprazole 40 mg. Called [phone]; spoke with Tacey Ruiz and approved over phone for omeprazole 40 mg and 20mg  01/22/12 - 02/11/13. Case # [id number]. Approval letter to follow. [facility] spoke with [facility] when thru.

## 2012-02-26 ENCOUNTER — Other Ambulatory Visit (INDEPENDENT_AMBULATORY_CARE_PROVIDER_SITE_OTHER): Payer: Self-pay | Admitting: General Surgery

## 2012-02-26 DIAGNOSIS — Z1231 Encounter for screening mammogram for malignant neoplasm of breast: Secondary | ICD-10-CM

## 2012-03-13 ENCOUNTER — Other Ambulatory Visit: Payer: Self-pay | Admitting: Family Medicine

## 2012-03-26 ENCOUNTER — Encounter: Payer: Self-pay | Admitting: Family Medicine

## 2012-03-26 ENCOUNTER — Ambulatory Visit (INDEPENDENT_AMBULATORY_CARE_PROVIDER_SITE_OTHER): Payer: Medicare Other | Admitting: Family Medicine

## 2012-03-26 VITALS — BP 132/74 | HR 74 | Temp 98.0°F | Ht 64.0 in | Wt 167.5 lb

## 2012-03-26 DIAGNOSIS — A048 Other specified bacterial intestinal infections: Secondary | ICD-10-CM

## 2012-03-26 DIAGNOSIS — K219 Gastro-esophageal reflux disease without esophagitis: Secondary | ICD-10-CM

## 2012-03-26 MED ORDER — AMOXICILL-CLARITHRO-LANSOPRAZ PO MISC: Freq: Two times a day (BID) | ORAL | Status: DC

## 2012-03-26 NOTE — Assessment & Plan Note (Signed)
In pt with gerd and recent gastritis symptoms  Will tx with prevpak-- amox/ clarithromycin/prevacid (will hold her omeprazole and statin while on this) Then report back  If no symptom free will ref to GI

## 2012-03-26 NOTE — Assessment & Plan Note (Signed)
Pt now having occ abd pain - although her heartburn is fairly controlled on the omeprazole 40  Also recent h pylori test came back pos tx with prevpac If not imp after that will ref to GI

## 2012-03-26 NOTE — Patient Instructions (Addendum)
Take the prevpac for the H pylori bacteria ( hold your statin and also hold the omeprazole) When done with it - can go back to those medicines  If stomach pain/ burping / heartburn - return after finishing the prevpac- we will refer you to GI- so let us know

## 2012-03-26 NOTE — Progress Notes (Signed)
Subjective:    Patient ID: Nancy [name], female    DOB: 27-Jun-1945, 67 y.o.   MRN: [id number]  HPI Here for f/u of GERD  At last visit to Dr Chestine Spore- he checked H pylori test-elevated abs  Right now takes prilosec 40 mg daily  Now having some stomach aches from time to time  No particular trigger  Overall feels pretty good  occ loose stool - again no particular trigger Not under a lot of stress  No n/v -- very seldom a bit queasy    Patient Active Problem List  Diagnosis  . HYPOTHYROIDISM  . HYPERLIPIDEMIA  . DIVERTICULOSIS, COLON  . RENAL CALCULUS  . FIBROCYSTIC BREAST DISEASE  . ROSACEA  . DEGENERATIVE DISC DISEASE  . BACK PAIN  . HYPERGLYCEMIA, FASTING  . ALLERGY  . NEPHROLITHIASIS, HX OF  . POSTMENOPAUSAL STATUS  . GERD (gastroesophageal reflux disease)  . H/O abnormal Pap smear  . Positive H. pylori test   Past Medical History  Diagnosis Date  . Thyroid disease   . Diverticulosis   . Hyperlipidemia   . 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 Fibrocystic breast   . Allergy   . Nephrolithiasis     Hx of  . Abnormal Pap smear     ASCUS  . GERD (gastroesophageal reflux disease)    Past Surgical History  Procedure Date  . Tonsillectomy 1968  . Thyroidectomy 1972  . Abdominal hysterectomy 2005  . Bunionectomy 2010  . Laparoscopy     D&C  . Dilation and curettage of uterus   . Tubal ligation   . Breast cyst aspiration 06/2003    and 04/2004  . Colonoscopy 2006  . Bilateral salpingoophorectomy 2005   History  Substance Use Topics  . Smoking status: Never Smoker   . Smokeless tobacco: Never Used  . Alcohol Use: 0.0 oz/week    2-3 Glasses of wine per week     Comment: 2-3 GLASSES OF WINE A WEEK   Family History  Problem Relation Age of Onset  . Cancer Father     lung CA  . Cancer Sister     melanoma  . Stroke Mother    Allergies  Allergen Reactions  . Adhesive (Tape)   . Ciprofloxacin     REACTION: rash  . Flagyl (Metronidazole Hcl)   .  Latex     REACTION: rash  . Metronidazole   . Sulfa Drugs Cross Reactors   . Sulfonamide Derivatives     REACTION: rash   Current Outpatient Prescriptions on File Prior to Visit  Medication Sig Dispense Refill  . aspirin 81 MG tablet Take 81 mg by mouth daily.        . Calcium Carbonate (CALTRATE 600 PO) Take 1 tablet by mouth daily.       . fexofenadine (ALLEGRA) 30 MG tablet Take 30 mg by mouth daily as needed.       . fluticasone (FLONASE) 50 MCG/ACT nasal spray 1 spray by Nasal route daily as needed.        Marland Kitchen GLUCOSAMINE HCL PO Take 1 tablet by mouth daily.       Marland Kitchen Ketotifen Fumarate (ZADITOR OP) Apply to eye as needed.       Marland Kitchen levothyroxine (SYNTHROID, LEVOTHROID) 88 MCG tablet Take 88 mcg by mouth daily.        . LUTEIN PO Take by mouth daily.      . Multiple Vitamin (MULTIVITAMIN) capsule Take 1  capsule by mouth daily.        . Multiple Vitamins-Minerals (CENTRUM PO) Take by mouth daily.      Marland Kitchen omeprazole (PRILOSEC) 40 MG capsule TAKE ONE CAPSULE BY MOUTH DAILY  30 capsule  5  . Polyethyl Glycol-Propyl Glycol (SYSTANE) 0.4-0.3 % SOLN Apply 1 drop to eye as needed.        . simvastatin (ZOCOR) 10 MG tablet TAKE ONE TABLET BY MOUTH EVERY NIGHT AT BEDTIME  30 tablet  5  . amoxicillin-clarithromycin-lansoprazole (PREVPAC) combo pack Take by mouth 2 (two) times daily. Follow package directions.  1 kit  0  . omeprazole (PRILOSEC) 20 MG capsule Take 20 mg by mouth every other day.        Review of Systems    Review of Systems  Constitutional: Negative for fever, appetite change, fatigue and unexpected weight change.  Eyes: Negative for pain and visual disturbance.  Respiratory: Negative for cough and shortness of breath.   Cardiovascular: Negative for cp or palpitations    Gastrointestinal: Negative for nausea, diarrhea and constipation. pos for upper abd pain and indigestion , neg for dark stools  Genitourinary: Negative for urgency and frequency.  Skin: Negative for pallor or rash    Neurological: Negative for weakness, light-headedness, numbness and headaches.  Hematological: Negative for adenopathy. Does not bruise/bleed easily.  Psychiatric/Behavioral: Negative for dysphoric mood. The patient is not nervous/anxious.      Objective:   Physical Exam  Constitutional: She appears well-developed and well-nourished. No distress.  HENT:  Head: Normocephalic and atraumatic.  Mouth/Throat: Oropharynx is clear and moist.  Eyes: Conjunctivae normal and EOM are normal. Pupils are equal, round, and reactive to light. Right eye exhibits no discharge. Left eye exhibits no discharge. No scleral icterus.  Neck: Normal range of motion. Neck supple. No thyromegaly present.  Cardiovascular: Normal rate and regular rhythm.   Pulmonary/Chest: Effort normal and breath sounds normal. No respiratory distress. She has no wheezes.  Abdominal: Soft. Bowel sounds are normal. She exhibits no distension and no mass. There is tenderness. There is no rebound and no guarding.       Very slight tenderness in epigastrium  Lymphadenopathy:    She has no cervical adenopathy.  Neurological: She is alert.  Skin: Skin is warm and dry. No pallor.  Psychiatric: She has a normal mood and affect.          Assessment & Plan:

## 2012-03-30 ENCOUNTER — Ambulatory Visit
Admission: RE | Admit: 2012-03-30 | Discharge: 2012-03-30 | Disposition: A | Discharge status: Home / Self Care | Payer: Medicare Other | Source: Ambulatory Visit | Attending: General Surgery | Admitting: General Surgery

## 2012-03-30 DIAGNOSIS — Z1231 Encounter for screening mammogram for malignant neoplasm of breast: Secondary | ICD-10-CM

## 2012-04-12 ENCOUNTER — Telehealth: Payer: Self-pay

## 2012-04-12 NOTE — Telephone Encounter (Signed)
The h pylori test shows antibodies- so once she has the infection it will continue to be positive  If her symptoms continue the next step is to get her to a GI specialist- they can do an endoscopy and biopsies for H pylori in the stomach/ esophagus (which is more of a definitive test) Let me know if symptoms do not resolve and I will refer to GI

## 2012-04-12 NOTE — Telephone Encounter (Signed)
Pt finished Prevpac on 04/11/12 for H pylori infection. Pt said she did not have symptoms before taking med for h pylori. Pt said she has no stomach pain but slight burping issue. Pt wants to know if needs to repeat lab test to make sure h pylori bacteria is gone. [facility].Please advise.

## 2012-04-12 NOTE — Telephone Encounter (Signed)
Pt notified of Dr. Royden Purl comments and verbalized understanding, pt said if sxs worsen will call back

## 2012-05-27 ENCOUNTER — Other Ambulatory Visit: Payer: Self-pay

## 2012-06-10 ENCOUNTER — Encounter (INDEPENDENT_AMBULATORY_CARE_PROVIDER_SITE_OTHER): Payer: Self-pay | Admitting: General Surgery

## 2012-06-10 ENCOUNTER — Ambulatory Visit (INDEPENDENT_AMBULATORY_CARE_PROVIDER_SITE_OTHER): Payer: BC Managed Care – PPO | Admitting: General Surgery

## 2012-06-10 VITALS — BP 122/72 | HR 60 | Temp 97.2°F | Resp 12 | Ht 64.0 in | Wt 164.0 lb

## 2012-06-10 DIAGNOSIS — N6012 Diffuse cystic mastopathy of left breast: Secondary | ICD-10-CM

## 2012-06-10 DIAGNOSIS — N6019 Diffuse cystic mastopathy of unspecified breast: Secondary | ICD-10-CM

## 2012-06-10 NOTE — Progress Notes (Signed)
Chief complaint: Followup for fibrocystic disease of the left breast abnormalities  History: Patient returns for followup for fibrocystic breast disease with a persistent area of thickening in the lateral aspect of the left breast. This has been stable on exam for at least 2 years with previous MRI and ultrasound showing benign changes. She reports no significant change since her last visit. She still feels some thickening in the outer left breast which seems to be more prominent at sometimes than others. No nipple discharge or bleeding.  Exam: BP 122/72  Pulse 60  Temp(Src) 97.2 F (36.2 C) (Temporal)  Resp 12  Ht 5\' 4"  (1.626 m)  Wt 164 lb (74.39 kg)  BMI 28.14 kg/m2 General: Well-appearing Caucasian female Lymph nodes: No cervical, supraclavicular or axillary nodes palpable Breasts: There is a somewhat subtle area of thickening beneath the old incision in the upper outer quadrant left breast. Not even sure it reaches the level of the discrete mass. Mild parenchymal thickening superiorly in the left breast. No discrete masses. Right breast is entirely negative.  Imaging: Clinical Data: Screening.  DIGITAL BILATERAL SCREENING MAMMOGRAM WITH CAD  DIGITAL BREAST TOMOSYNTHESIS  Digital breast tomosynthesis images are acquired in two  projections. These images are reviewed in combination with the  digital mammogram, confirming the findings below.  Comparison: 05/05/2007  FINDINGS:  ACR Breast Density Category 2: There are scattered fibroglandular  densities.  There is no suspicious dominant mass, architectural distortion, or  calcification to suggest malignancy.  Images were processed with CAD.  IMPRESSION:  No mammographic evidence of malignancy.  Assessment and plan: Fibrocystic breast disease with no evidence of progression or worrisome mass clinically or on imaging. Return in one year.

## 2012-06-10 NOTE — Progress Notes (Signed)
Patient called office regarding "reason for visit" notes (LTF Breast Cancer) this is incorrect and has been changed to Long Term Follow Up Fibrocystic Breast Disease.  Patient has bee notified of this change.

## 2012-08-03 ENCOUNTER — Ambulatory Visit (INDEPENDENT_AMBULATORY_CARE_PROVIDER_SITE_OTHER): Payer: Medicare Other | Admitting: Family Medicine

## 2012-08-03 ENCOUNTER — Encounter: Payer: Self-pay | Admitting: Family Medicine

## 2012-08-03 VITALS — BP 120/64 | HR 68 | Temp 98.1°F | Wt 162.0 lb

## 2012-08-03 DIAGNOSIS — W57XXXA Bitten or stung by nonvenomous insect and other nonvenomous arthropods, initial encounter: Secondary | ICD-10-CM

## 2012-08-03 NOTE — Progress Notes (Signed)
Subjective:    Patient ID: Nancy [name], female    DOB: 13-Aug-1945, 67 y.o.   MRN: [id number]  HPI Very pleasant 67 yo female here for rash around tick bite she had on her right leg.  Had two tick bites- one on her back on one on her right calf 2 weeks ago.  She is not sure how long they were embedded but they both were engorged.  No rash developed around the one on her back but she did develop a reddened area about the size of a quarter on her calf.  No central clearing.  No fevers, chills, HA, nausea, vomiting or other rashes.  Area is getting smaller.  Does still itch. No drainage.  Patient Active Problem List   Diagnosis Date Noted  . Positive H. pylori test 03/26/2012  . H/O abnormal Pap smear 05/06/2011  . GERD (gastroesophageal reflux disease) 07/16/2010  . BACK PAIN 09/06/2009  . NEPHROLITHIASIS, HX OF 09/06/2009  . HYPERGLYCEMIA, FASTING 08/22/2009  . POSTMENOPAUSAL STATUS 08/29/2008  . HYPOTHYROIDISM 05/18/2006  . HYPERLIPIDEMIA 05/18/2006  . DIVERTICULOSIS, COLON 05/18/2006  . RENAL CALCULUS 05/18/2006  . FIBROCYSTIC BREAST DISEASE 05/18/2006  . ROSACEA 05/18/2006  . DEGENERATIVE DISC DISEASE 05/18/2006  . ALLERGY 05/18/2006   Past Medical History  Diagnosis Date  . Thyroid disease   . Diverticulosis   . Hyperlipidemia   . 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 Fibrocystic breast   . Allergy   . Nephrolithiasis     Hx of  . Abnormal Pap smear     ASCUS  . GERD (gastroesophageal reflux disease)    Past Surgical History  Procedure Laterality Date  . Tonsillectomy  1968  . Thyroidectomy  1972  . Abdominal hysterectomy  2005  . Bunionectomy  2010  . Laparoscopy      D&C  . Dilation and curettage of uterus    . Tubal ligation    . Breast cyst aspiration  06/2003    and 04/2004  . Colonoscopy  2006  . Bilateral salpingoophorectomy  2005   History  Substance Use Topics  . Smoking status: Never Smoker   . Smokeless tobacco: Never Used  . Alcohol Use:  0.0 oz/week    2-3 Glasses of wine per week     Comment: 2-3 GLASSES OF WINE A WEEK   Family History  Problem Relation Age of Onset  . Cancer Father     lung CA  . Cancer Sister     melanoma  . Stroke Mother    Allergies  Allergen Reactions  . Adhesive (Tape)   . Ciprofloxacin     REACTION: rash  . Flagyl (Metronidazole Hcl)   . Latex     REACTION: rash  . Metronidazole   . Sulfa Drugs Cross Reactors   . Sulfonamide Derivatives     REACTION: rash   Current Outpatient Prescriptions on File Prior to Visit  Medication Sig Dispense Refill  . aspirin 81 MG tablet Take 81 mg by mouth daily.        . Biotin 1 MG CAPS Take by mouth.      . Calcium Carbonate (CALTRATE 600 PO) Take 1 tablet by mouth daily.       . fexofenadine (ALLEGRA) 30 MG tablet Take 30 mg by mouth daily as needed.       . fluticasone (FLONASE) 50 MCG/ACT nasal spray 1 spray by Nasal route daily as needed.        Marland Kitchen  GLUCOSAMINE HCL PO Take 1 tablet by mouth daily.       Marland Kitchen Ketotifen Fumarate (ZADITOR OP) Apply to eye as needed.       Marland Kitchen levothyroxine (SYNTHROID, LEVOTHROID) 88 MCG tablet Take 88 mcg by mouth daily.        . LUTEIN PO Take by mouth daily.      . Multiple Vitamins-Minerals (CENTRUM PO) Take by mouth daily.      Marland Kitchen omeprazole (PRILOSEC) 40 MG capsule TAKE ONE CAPSULE BY MOUTH DAILY  30 capsule  5  . Polyethyl Glycol-Propyl Glycol (SYSTANE) 0.4-0.3 % SOLN Apply 1 drop to eye as needed.        . simvastatin (ZOCOR) 10 MG tablet TAKE ONE TABLET BY MOUTH EVERY NIGHT AT BEDTIME  30 tablet  5   No current facility-administered medications on file prior to visit.   The PMH, PSH, Social History, Family History, Medications, and allergies have been reviewed in [facility], and have been updated if relevant.   Review of Systems See HPI    Objective:   Physical Exam BP 120/64  Pulse 68  Temp(Src) 98.1 F (36.7 C)  Wt 162 lb (73.483 kg)  BMI 27.79 kg/m2 Gen:  Alert, pleasant, NAD, non  toxic PERRL Skin: Small raised erythema (dime sized) on right calf, no central clearing, no drainage or warmth No other rashes Psych:  Good eye contact, not anxious or depressed appearing   Assessment & Plan:  1.  Tick bite-  Reassurance provided.  No indication of tick borne illness.  Likely a local inflammatory response. Advised antihistamine for itching. Call or return to clinic prn if these symptoms worsen or fail to improve as anticipated. The patient indicates understanding of these issues and agrees with the plan.

## 2012-08-03 NOTE — Patient Instructions (Addendum)
Good to see you. Your tick bite looks good- it looks a local inflammatory response.  Ok to take Benadryl or Zyrtec as needed for itching.  Call if not better in the next few days.

## 2012-09-24 ENCOUNTER — Other Ambulatory Visit: Payer: Self-pay | Admitting: Family Medicine

## 2012-10-15 ENCOUNTER — Other Ambulatory Visit: Payer: Self-pay | Admitting: Family Medicine

## 2012-10-15 NOTE — Telephone Encounter (Signed)
Please schedule f/u or PE (if she prefers) in early winter and refill until then-thanks

## 2012-10-15 NOTE — Telephone Encounter (Signed)
No recent appt and no future appt., please advise  

## 2012-10-18 NOTE — Telephone Encounter (Signed)
CPE scheduled for 01/24/13 and med refilled

## 2013-01-24 ENCOUNTER — Ambulatory Visit (INDEPENDENT_AMBULATORY_CARE_PROVIDER_SITE_OTHER): Payer: Medicare Other | Admitting: Family Medicine

## 2013-01-24 ENCOUNTER — Encounter: Payer: Self-pay | Admitting: Family Medicine

## 2013-01-24 VITALS — BP 104/66 | HR 63 | Temp 98.2°F | Ht 63.5 in | Wt 163.8 lb

## 2013-01-24 DIAGNOSIS — R7309 Other abnormal glucose: Secondary | ICD-10-CM

## 2013-01-24 DIAGNOSIS — Z0000 Encounter for general adult medical examination without abnormal findings: Secondary | ICD-10-CM

## 2013-01-24 DIAGNOSIS — E785 Hyperlipidemia, unspecified: Secondary | ICD-10-CM

## 2013-01-24 DIAGNOSIS — E039 Hypothyroidism, unspecified: Secondary | ICD-10-CM

## 2013-01-24 DIAGNOSIS — Z23 Encounter for immunization: Secondary | ICD-10-CM

## 2013-01-24 LAB — COMPREHENSIVE METABOLIC PANEL
AST: 26 U/L (ref 0–37)
Alkaline Phosphatase: 53 U/L (ref 39–117)
Glucose, Bld: 99 mg/dL (ref 70–99)
Sodium: 142 mEq/L (ref 135–145)
Total Bilirubin: 0.3 mg/dL (ref 0.3–1.2)
Total Protein: 6.4 g/dL (ref 6.0–8.3)

## 2013-01-24 LAB — CBC WITH DIFFERENTIAL/PLATELET
Basophils Absolute: 0 10*3/uL (ref 0.0–0.1)
Eosinophils Absolute: 0.1 10*3/uL (ref 0.0–0.7)
HCT: 40.9 % (ref 36.0–46.0)
Lymphs Abs: 2.1 10*3/uL (ref 0.7–4.0)
MCHC: 34.2 g/dL (ref 30.0–36.0)
MCV: 94.7 fl (ref 78.0–100.0)
Monocytes Absolute: 0.3 10*3/uL (ref 0.1–1.0)
Neutrophils Relative %: 54.1 % (ref 43.0–77.0)
Platelets: 225 10*3/uL (ref 150.0–400.0)
RDW: 12.6 % (ref 11.5–14.6)

## 2013-01-24 LAB — LIPID PANEL
Cholesterol: 176 mg/dL (ref 0–200)
LDL Cholesterol: 93 mg/dL (ref 0–99)
Total CHOL/HDL Ratio: 3
VLDL: 30 mg/dL (ref 0.0–40.0)

## 2013-01-24 MED ORDER — SIMVASTATIN 10 MG PO TABS: 10.0000 mg | ORAL_TABLET | Freq: Every day | ORAL | Status: DC

## 2013-01-24 MED ORDER — OMEPRAZOLE 40 MG PO CPDR: 40.0000 mg | DELAYED_RELEASE_CAPSULE | Freq: Every day | ORAL | Status: DC

## 2013-01-24 NOTE — Progress Notes (Signed)
Subjective:    Patient ID: Nancy [name], female    DOB: 24-Mar-1945, 67 y.o.   MRN: [id number]  HPI I have personally reviewed the Medicare Annual Wellness questionnaire and have noted 1. The patient's medical and social history 2. Their use of alcohol, tobacco or illicit drugs 3. Their current medications and supplements 4. The patient's functional ability including ADL's, fall risks, home safety risks and hearing or visual             impairment. 5. Diet and physical activities 6. Evidence for depression or mood disorders  The patients weight, height, BMI have been recorded in the chart and visual acuity is per eye clinic.  I have made referrals, counseling and provided education to the patient based review of the above and I have provided the pt with a written personalized care plan for preventive services.  Doing pretty well  Due for labs today Still has GI issues -no change in that   See scanned forms.  Routine anticipatory guidance given to patient.  See health maintenance. Flu vaccine 11/14 Shingles vaccine 7/09 PNA 10/04 - will get that today  Tetanus vaccine 9/03- in our charts - then she had a cut on thumb and she had 2011 Colonoscopy 1/06 - has not had one since then - diverticulosis (Dr Juanda Chance)  Breast cancer screening 2/14 mammogram (breast center)- she gets a yearly letter to schedule Also Dr Johna Sheriff checks her frequently for a lump that cannot be aspirated (last [place] neg)-- can feel it but cannot see it (she has fibrocystic breasts)  Pap nl 3/13 with gyn and she has had a hysterectomy (had a supracervical hysterectomy)  Advance directive-she does have a living will set up  Cognitive function addressed- see scanned forms- and if abnormal then additional documentation follows. - no significant memory problems    PMH and SH reviewed  Meds, vitals, and allergies reviewed.   ROS: See HPI.  Otherwise negative.    Due for labs   Hypothyroid No clinical changes -  she sees Dr Chestine Spore once per year or more -no problems lately  Lab Results  Component Value Date   TSH 0.58 07/31/2009    Hyperglycemia Lab Results  Component Value Date   HGBA1C 5.8 02/05/2010   thinks there will not be a change  No symptoms    Wt is up 1 lb with bmi of 28  Hyperlipidemia Lab Results  Component Value Date   CHOL 176 07/31/2009   HDL 60.10 07/31/2009   LDLCALC 96 07/31/2009   LDLDIRECT 162.3 11/20/2008   TRIG 98.0 07/31/2009   CHOLHDL 3 07/31/2009   still on simvastatin  Diet is fairly good   She started a "chair yoga" program   Patient Active Problem List   Diagnosis Date Noted  . Positive H. pylori test 03/26/2012  . H/O abnormal Pap smear 05/06/2011  . GERD (gastroesophageal reflux disease) 07/16/2010  . BACK PAIN 09/06/2009  . NEPHROLITHIASIS, HX OF 09/06/2009  . HYPERGLYCEMIA, FASTING 08/22/2009  . POSTMENOPAUSAL STATUS 08/29/2008  . HYPOTHYROIDISM 05/18/2006  . HYPERLIPIDEMIA 05/18/2006  . DIVERTICULOSIS, COLON 05/18/2006  . RENAL CALCULUS 05/18/2006  . FIBROCYSTIC BREAST DISEASE 05/18/2006  . ROSACEA 05/18/2006  . DEGENERATIVE DISC DISEASE 05/18/2006  . ALLERGY 05/18/2006   Past Medical History  Diagnosis Date  . Thyroid disease   . Diverticulosis   . Hyperlipidemia   . 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 Fibrocystic breast   . Allergy   .  Nephrolithiasis     Hx of  . Abnormal Pap smear     ASCUS  . GERD (gastroesophageal reflux disease)    Past Surgical History  Procedure Laterality Date  . Tonsillectomy  1968  . Thyroidectomy  1972  . Abdominal hysterectomy  2005  . Bunionectomy  2010  . Laparoscopy      D&C  . Dilation and curettage of uterus    . Tubal ligation    . Breast cyst aspiration  06/2003    and 04/2004  . Colonoscopy  2006  . Bilateral salpingoophorectomy  2005   History  Substance Use Topics  . Smoking status: Never Smoker   . Smokeless tobacco: Never Used  . Alcohol Use: 0.0 oz/week    2-3 Glasses of wine per week      Comment: 2-3 GLASSES OF WINE A WEEK   Family History  Problem Relation Age of Onset  . Cancer Father     lung CA  . Cancer Sister     melanoma  . Stroke Mother    Allergies  Allergen Reactions  . Adhesive [Tape]   . Bee Venom   . Ciprofloxacin     REACTION: rash  . Flagyl [Metronidazole Hcl]   . Latex     REACTION: rash  . Metronidazole   . Strawberry   . Sulfa Drugs Cross Reactors   . Sulfonamide Derivatives     REACTION: rash   Current Outpatient Prescriptions on File Prior to Visit  Medication Sig Dispense Refill  . aspirin 81 MG tablet Take 81 mg by mouth daily.        . Biotin 1 MG CAPS Take by mouth.      . fluticasone (FLONASE) 50 MCG/ACT nasal spray 1 spray by Nasal route daily as needed.        Marland Kitchen GLUCOSAMINE HCL PO Take 1 tablet by mouth daily.       Marland Kitchen Ketotifen Fumarate (ZADITOR OP) Apply to eye as needed.       Marland Kitchen levothyroxine (SYNTHROID, LEVOTHROID) 88 MCG tablet Take 88 mcg by mouth daily.        . LUTEIN PO Take by mouth daily.      . Multiple Vitamins-Minerals (CENTRUM PO) Take by mouth daily.      Marland Kitchen omeprazole (PRILOSEC) 40 MG capsule TAKE ONE CAPSULE BY MOUTH DAILY  30 capsule  5  . Polyethyl Glycol-Propyl Glycol (SYSTANE) 0.4-0.3 % SOLN Apply 1 drop to eye as needed.        . simvastatin (ZOCOR) 10 MG tablet TAKE ONE TABLET BY MOUTH EVERY NIGHT AT BEDTIME  30 tablet  3   No current facility-administered medications on file prior to visit.    Review of Systems Review of Systems  Constitutional: Negative for fever, appetite change, fatigue and unexpected weight change.  Eyes: Negative for pain and visual disturbance.  Respiratory: Negative for cough and shortness of breath.   Cardiovascular: Negative for cp or palpitations    Gastrointestinal: Negative for nausea, diarrhea and constipation.  Genitourinary: Negative for urgency and frequency.  Skin: Negative for pallor or rash   Neurological: Negative for weakness, light-headedness, numbness and  headaches.  Hematological: Negative for adenopathy. Does not bruise/bleed easily.  Psychiatric/Behavioral: Negative for dysphoric mood. The patient is not nervous/anxious.         Objective:   Physical Exam  Constitutional: She appears well-developed and well-nourished. No distress.  HENT:  Head: Normocephalic and atraumatic.  Right Ear: External ear  normal.  Left Ear: External ear normal.  Mouth/Throat: Oropharynx is clear and moist.  Eyes: Conjunctivae and EOM are normal. Pupils are equal, round, and reactive to light. No scleral icterus.  Neck: Normal range of motion. Neck supple. No JVD present. Carotid bruit is not present. No thyromegaly present.  Cardiovascular: Normal rate, regular rhythm, normal heart sounds and intact distal pulses.  Exam reveals no gallop.   Pulmonary/Chest: Effort normal and breath sounds normal. No respiratory distress. She has no wheezes. She exhibits no tenderness.  Abdominal: Soft. Bowel sounds are normal. She exhibits no distension, no abdominal bruit and no mass. There is no tenderness.  Genitourinary:  Exam def for gyn physician   Musculoskeletal: Normal range of motion. She exhibits no edema and no tenderness.  Lymphadenopathy:    She has no cervical adenopathy.  Neurological: She is alert. She has normal reflexes. No cranial nerve deficit. She exhibits normal muscle tone. Coordination normal.  Skin: Skin is warm and dry. No rash noted. No erythema. No pallor.  Psychiatric: She has a normal mood and affect.          Assessment & Plan:

## 2013-01-24 NOTE — Assessment & Plan Note (Signed)
Reviewed health habits including diet and exercise and skin cancer prevention Reviewed appropriate screening tests for age  Also reviewed health mt list, fam hx and immunization status , as well as social and family history   See HPI Labs ordered today Pneumovax today Pt will schedule her own mammograms and f/u with Dr Johna Sheriff and Dr Shawnie Pons as planned for breast exams/ follow up

## 2013-01-24 NOTE — Assessment & Plan Note (Signed)
Pt sees Dr Chestine Spore for this - has been clinically stable

## 2013-01-24 NOTE — Patient Instructions (Signed)
Labs today  Pneumonia vaccine today Don't forget to schedule your mammogram for Feb  Call Dr Pratt's office to see when you are due for annual gyn exam/ pap

## 2013-01-24 NOTE — Assessment & Plan Note (Signed)
Disc goals for lipids and reasons to control the Rev low sat fat diet in detail  On simvastatin and diet

## 2013-01-24 NOTE — Assessment & Plan Note (Signed)
A1C today Diet is stable Enc exercise

## 2013-01-24 NOTE — Progress Notes (Signed)
Pre-visit discussion using our clinic review tool. No additional management support is needed unless otherwise documented below in the visit note.  

## 2013-01-25 ENCOUNTER — Encounter: Payer: Self-pay | Admitting: *Deleted

## 2013-03-01 ENCOUNTER — Telehealth: Payer: Self-pay

## 2013-03-01 NOTE — Telephone Encounter (Signed)
Pt left v/m requesting status of omeprazole prior [place]; spoke with [place] at [place] and [place] was faxed on 02/28/13.Please advise.

## 2013-03-02 NOTE — Telephone Encounter (Signed)
Recieved prior auth request from pharmacy for Omeprazole .  I obtained authorization verbally from insurance, and I notified pharmacy of approval via fax. Waiting on approval notice from insurance company to arrive via fax. Once forms are received I will send forms to be signed by provider then scanned into patients medical record. Pa

## 2013-03-04 ENCOUNTER — Other Ambulatory Visit: Payer: Self-pay

## 2013-03-04 DIAGNOSIS — Z1231 Encounter for screening mammogram for malignant neoplasm of breast: Secondary | ICD-10-CM

## 2013-03-31 ENCOUNTER — Ambulatory Visit
Admission: RE | Admit: 2013-03-31 | Discharge: 2013-03-31 | Disposition: A | Discharge status: Home / Self Care | Payer: BC Managed Care – PPO | Source: Ambulatory Visit

## 2013-03-31 DIAGNOSIS — Z1231 Encounter for screening mammogram for malignant neoplasm of breast: Secondary | ICD-10-CM

## 2013-04-05 ENCOUNTER — Encounter: Payer: Self-pay | Admitting: Family Medicine

## 2013-06-13 ENCOUNTER — Other Ambulatory Visit (HOSPITAL_COMMUNITY)
Admission: RE | Admit: 2013-06-13 | Discharge: 2013-06-13 | Disposition: A | Discharge status: Home / Self Care | Payer: Medicare Other | Source: Ambulatory Visit | Attending: Family Medicine | Admitting: Family Medicine

## 2013-06-13 ENCOUNTER — Ambulatory Visit (INDEPENDENT_AMBULATORY_CARE_PROVIDER_SITE_OTHER): Payer: Medicare Other | Admitting: Family Medicine

## 2013-06-13 ENCOUNTER — Encounter: Payer: Self-pay | Admitting: Family Medicine

## 2013-06-13 VITALS — BP 130/78 | HR 91 | Ht 64.0 in | Wt 166.0 lb

## 2013-06-13 DIAGNOSIS — Z1151 Encounter for screening for human papillomavirus (HPV): Secondary | ICD-10-CM | POA: Insufficient documentation

## 2013-06-13 DIAGNOSIS — Z01419 Encounter for gynecological examination (general) (routine) without abnormal findings: Secondary | ICD-10-CM

## 2013-06-13 DIAGNOSIS — Z124 Encounter for screening for malignant neoplasm of cervix: Secondary | ICD-10-CM

## 2013-06-13 NOTE — Patient Instructions (Signed)

## 2013-06-13 NOTE — Progress Notes (Signed)
  Subjective:     Nancy [name] is a 68 y.o. female and is here for a comprehensive physical exam. The patient reports no problems.  History   Social History  . Marital Status: Married    Spouse Name: N/A    Number of Children: N/A  . Years of Education: N/A   Occupational History  . Not on file.   Social History Main Topics  . Smoking status: Never Smoker   . Smokeless tobacco: Never Used  . Alcohol Use: 0.0 oz/week    2-3 Glasses of wine per week     Comment: 2-3 GLASSES OF WINE A WEEK  . Drug Use: No  . Sexual Activity: No   Other Topics Concern  . Not on file   Social History Narrative  . No narrative on file   Health Maintenance  Topic Date Due  . Influenza Vaccine  09/24/2013  . Colonoscopy  02/25/2014  . Mammogram  04/01/2015  . Tetanus/tdap  02/26/2019  . Pneumococcal Polysaccharide Vaccine Age 74 And Over  Completed  . Zostavax  Completed    The following portions of the patient's history were reviewed and updated as appropriate: allergies, current medications, past family history, past medical history, past social history, past surgical history and problem list.  Review of Systems A comprehensive review of systems was negative.   Objective:    BP 130/78  Pulse 91  Ht 5\' 4"  (1.626 m)  Wt 166 lb (75.297 kg)  BMI 28.48 kg/m2 General appearance: alert, cooperative and appears stated age Head: Normocephalic, without obvious abnormality, atraumatic Neck: no adenopathy, supple, symmetrical, trachea midline and thyroid not enlarged, symmetric, no tenderness/mass/nodules Lungs: clear to auscultation bilaterally Breasts: normal appearance, no masses or tenderness Heart: regular rate and rhythm, S1, S2 normal, no murmur, click, rub or gallop Abdomen: soft, non-tender; bowel sounds normal; no masses,  no organomegaly Pelvic: cervix normal in appearance, external genitalia normal, no cervical motion tenderness and vagina atrophic, uterus surgically  absent Extremities: extremities normal, atraumatic, no cyanosis or edema Pulses: 2+ and symmetric Skin: Skin color, texture, turgor normal. No rashes or lesions Lymph nodes: Cervical, supraclavicular, and axillary nodes normal. Neurologic: Grossly normal    Assessment:    Healthy female exam.      Plan:  Pap with HPV this year--if negative may stop having pap smears. Last pap with cryo in her 40's.  Continue other health maintenance with primary MD. See After Visit Summary for Counseling Recommendations

## 2013-06-17 ENCOUNTER — Ambulatory Visit (INDEPENDENT_AMBULATORY_CARE_PROVIDER_SITE_OTHER): Payer: BC Managed Care – PPO | Admitting: General Surgery

## 2013-07-12 ENCOUNTER — Ambulatory Visit (INDEPENDENT_AMBULATORY_CARE_PROVIDER_SITE_OTHER): Payer: Medicare Other | Admitting: General Surgery

## 2013-07-12 ENCOUNTER — Encounter (INDEPENDENT_AMBULATORY_CARE_PROVIDER_SITE_OTHER): Payer: Self-pay | Admitting: General Surgery

## 2013-07-12 VITALS — BP 112/70 | HR 78 | Temp 97.6°F | Resp 12 | Ht 64.0 in | Wt 167.2 lb

## 2013-07-12 DIAGNOSIS — N6019 Diffuse cystic mastopathy of unspecified breast: Secondary | ICD-10-CM

## 2013-07-12 NOTE — Progress Notes (Signed)
Chief complaint: Followup for fibrocystic disease of the left breast abnormalities  History: Patient returns for followup for fibrocystic breast disease with a persistent area of thickening in the lateral aspect of the left breast. This has been stable on exam for at least 2 years with previous MRI and ultrasound showing benign changes. She reports no significant change since her last visit. She still feels some thickening in the outer left breast which seems to be more prominent at sometimes than others. No nipple discharge or bleeding. She has occasionally noticed some pulling sensation in her left breast when extending her arm for some ropelike thickening in the upper breast that comes and goes it is not present currently.  Exam: BP 112/70  Pulse 78  Temp(Src) 97.6 F (36.4 C) (Temporal)  Resp 12  Ht 5\' 4"  (1.626 m)  Wt 167 lb 3.2 oz (75.841 kg)  BMI 28.69 kg/m2 General: Well-appearing Caucasian female Lymph nodes: No cervical, supraclavicular or axillary nodes palpable Breasts: There is a somewhat subtle area of thickening beneath the old incision in the upper outer quadrant left breast. Not even sure it reaches the level of the discrete mass. Approximately 3-4 cm area. Mild parenchymal thickening superiorly in the left breast. No discrete masses. Right breast is entirely negative.  Imaging: Mammogram in February of this year was negative  Assessment and plan: Fibrocystic breast disease with no evidence of progression or worrisome mass clinically or on imaging. Return in one year.

## 2013-11-09 ENCOUNTER — Ambulatory Visit (INDEPENDENT_AMBULATORY_CARE_PROVIDER_SITE_OTHER): Payer: Medicare Other

## 2013-11-09 ENCOUNTER — Ambulatory Visit (INDEPENDENT_AMBULATORY_CARE_PROVIDER_SITE_OTHER): Payer: Medicare Other | Admitting: Podiatry

## 2013-11-09 ENCOUNTER — Encounter: Payer: Self-pay | Admitting: Podiatry

## 2013-11-09 VITALS — BP 112/64 | HR 73 | Resp 16 | Ht 64.0 in | Wt 164.0 lb

## 2013-11-09 DIAGNOSIS — M779 Enthesopathy, unspecified: Secondary | ICD-10-CM

## 2013-11-09 MED ORDER — MELOXICAM 15 MG PO TABS: 15.0000 mg | ORAL_TABLET | Freq: Every day | ORAL | Status: DC

## 2013-11-09 NOTE — Progress Notes (Signed)
   Subjective:    Patient ID: Nancy [name], female    DOB: 11/04/1945, 68 y.o.   MRN: [id number]  HPI Comments: Its the side of my left foot (lateral). Its sore by the end of the day. Its been hurting 3 - 4 weeks. Its gotten worse. It hurts to walk. i will elevate if it gets really achy.  Foot Pain      Review of Systems  HENT: Positive for sinus pressure.   Gastrointestinal:       Bloating  Endocrine: Positive for cold intolerance.  Allergic/Immunologic: Positive for food allergies.  All other systems reviewed and are negative.      Objective:   Physical Exam: I have reviewed her past medical history medications allergies surgeries social history and review of systems. Pulses are strongly palpable bilateral. Neurologic sensorium is intact per since once the monofilament. Deep tendon reflexes are intact bilateral muscle strength is 5 over 5 dorsiflexors plantar flexors inverters everters all musculature is intact. Orthopedic evaluation demonstrates all joints distal to the ankle a full range of motion without crepitation. She has pain on palpation of the base of the fifth metatarsal left foot with mild overlying edema she has pain on palpation of the peroneal tendons particularly the prone his longus as it courses beneath the cuboid. Radiographic evaluation does not demonstrate any type of osseous abnormalities other than os perineum within the prone his longus tendon this does appear to be within the cuboid all arcuate region.        Assessment & Plan:  Assessment: Metatarsalgia fifth metatarsal base left foot with her on his longus tendinitis left.  Plan: Discussed etiology pathology conservative versus surgical therapies. Injected the area today with Kenalog and local anesthetic. Started her on meloxicam and will followup with her in the near future.

## 2013-11-24 ENCOUNTER — Ambulatory Visit (INDEPENDENT_AMBULATORY_CARE_PROVIDER_SITE_OTHER): Payer: Medicare Other | Admitting: Podiatry

## 2013-11-24 DIAGNOSIS — M7672 Peroneal tendinitis, left leg: Secondary | ICD-10-CM

## 2013-11-24 NOTE — Patient Instructions (Signed)
Peroneal Tendinitis with Rehab Tendonitis is inflammation of a tendon. Inflammation of the tendons on the back of the outer ankle (peroneal tendons) is known as peroneal tendonitis. The peroneal tendons are responsible for connecting the muscles that allow you to stand on your tiptoes to the bones of the ankle. For this reason, peroneal tendonitis often causes pain when trying to complete such motions. Peroneal tendonitis often involves a tear (strain) of the peroneal tendons. Strains are classified into three categories. Grade 1 strains cause pain, but the tendon is not lengthened. Grade 2 strains include a lengthened ligament, due to the ligament being stretched or partially ruptured. With grade 2 strains there is still function, although function may be decreased. Grade 3 strains involve a complete tear of the tendon or muscle, and function is usually impaired. SYMPTOMS   Pain, tenderness, swelling, warmth, or redness over the back of the outer side of the ankle, the outer part of the mid-foot, or the bottom of the arch.  Pain that gets worse with ankle motion (especially when pushing off or pushing down with the front of the foot), or when standing on the ball of the foot or pushing the foot outward.  Crackling sound (crepitation) when the tendon is moved or touched. CAUSES  Peroneal tendinitis occurs when injury to the peroneal tendons causes the body to respond with inflammation. Common causes of injury include:  An overuse injury, in which the groove behind the outer ankle (where the tendon is located) causes wear on the tendon.  A sudden stress placed on the tendon, such as from an increase in the intensity, frequency, or duration of training.  Direct hit (trauma) to the tendon.  Return to activity too soon after a previous ankle injury. RISK INCREASES WITH:  Sports that require sudden, repetitive pushing off of the foot, such as jumping or quick starts.  Kicking and running sports,  especially running down hills or long distances.  Poor strength and flexibility.  Previous injury to the foot, ankle, or leg. PREVENTION  Warm up and stretch properly before activity.  Allow for adequate recovery between workouts.  Maintain physical fitness:  Strength, flexibility, and endurance.  Cardiovascular fitness.  Complete rehabilitation after previous injury. PROGNOSIS  If treated properly, peroneal tendonitis usually heals within 6 weeks.  RELATED COMPLICATIONS  Longer healing time, if not properly treated or if not given enough time to heal.  Recurring symptoms if activity is resumed too soon, with overuse, or when using poor technique.  If untreated, tendinitis may result in tendon rupture, requiring surgery. TREATMENT  Treatment first involves the use of ice and medicine to reduce pain and inflammation. The use of strengthening and stretching exercises may help reduce pain with activity. These exercises may be performed at home or with a therapist. Sometimes, the foot and ankle will be restrained for 10 to 14 days to promote healing. Your caregiver may advise that you place a heel lift in your shoes to reduce the stress placed on the tendon. If nonsurgical treatment is unsuccessful, surgery to remove the inflamed tendon lining (sheath) may be advised.  MEDICATION   If pain medicine is needed, nonsteroidal anti-inflammatory medicines (aspirin and ibuprofen), or other minor pain relievers (acetaminophen), are often advised.  Do not take pain medicine for 7 days before surgery.  Prescription pain relievers may be given, if your caregiver thinks they are needed. Use only as directed and only as much as you need. HEAT AND COLD  Cold treatment (icing) should   be applied for 10 to 15 minutes every 2 to 3 hours for inflammation and pain, and immediately after activity that aggravates your symptoms. Use ice packs or an ice massage.  Heat treatment may be used before  performing stretching and strengthening activities prescribed by your caregiver, physical therapist, or athletic trainer. Use a heat pack or a warm water soak. SEEK MEDICAL CARE IF:  Symptoms get worse or do not improve in 2 to 4 weeks, despite treatment.  New, unexplained symptoms develop. (Drugs used in treatment may produce side effects.) EXERCISES RANGE OF MOTION (ROM) AND STRETCHING EXERCISES - Peroneal Tendinitis These exercises may help you when beginning to rehabilitate your injury. Your symptoms may resolve with or without further involvement from your physician, physical therapist or athletic trainer. While completing these exercises, remember:   Restoring tissue flexibility helps normal motion to return to the joints. This allows healthier, less painful movement and activity.  An effective stretch should be held for at least 30 seconds.  A stretch should never be painful. You should only feel a gentle lengthening or release in the stretched tissue. RANGE OF MOTION - Ankle Eversion  Sit with your right / left ankle crossed over your opposite knee.  Grip your foot with your opposite hand, placing your thumb on the top of your foot and your fingers across the bottom of your foot.  Gently push your foot downward with a slight rotation, so your littlest toes rise slightly toward the ceiling.  You should feel a gentle stretch on the inside of your ankle. Hold the stretch for __________ seconds. Repeat __________ times. Complete this exercise __________ times per day.  RANGE OF MOTION - Ankle Inversion  Sit with your right / left ankle crossed over your opposite knee.  Grip your foot with your opposite hand, placing your thumb on the bottom of your foot and your fingers across the top of your foot.  Gently pull your foot so the smallest toe comes toward you and your thumb pushes the inside of the ball of your foot away from you.  You should feel a gentle stretch on the outside of  your ankle. Hold the stretch for __________ seconds. Repeat __________ times. Complete this exercise __________ times per day.  RANGE OF MOTION - Ankle Plantar Flexion  Sit with your right / left leg crossed over your opposite knee.  Use your opposite hand to pull the top of your foot and toes toward you.  You should feel a gentle stretch on the top of your foot and ankle. Hold this position for __________ seconds. Repeat __________ times. Complete __________ times per day.  STRETCH - Gastroc, Standing  Place your hands on a wall.  Extend your right / left leg behind you, keeping the front knee somewhat bent.  Slightly point your toes inward on your back foot.  Keeping your right / left heel on the floor and your knee straight, shift your weight toward the wall, not allowing your back to arch.  You should feel a gentle stretch in the calf. Hold this position for __________ seconds. Repeat __________ times. Complete this stretch __________ times per day. STRETCH - Soleus, Standing  Place your hands on a wall.  Extend your right / left leg behind you, keeping the other knee somewhat bent.  Slightly point your toes inward on your back foot.  Keep your heel on the floor, bend your back knee, and slightly shift your weight over the back leg so that   you feel a gentle stretch deep in your back calf.  Hold this position for __________ seconds. Repeat __________ times. Complete this stretch __________ times per day. STRETCH - Gastrocsoleus, Standing Note: This exercise can place a lot of stress on your foot and ankle. Please complete this exercise only if specifically instructed by your caregiver.   Place the ball of your right / left foot on a step, keeping your other foot firmly on the same step.  Hold on to the wall or a rail for balance.  Slowly lift your other foot, allowing your body weight to press your heel down over the edge of the step.  You should feel a stretch in your  right / left calf.  Hold this position for __________ seconds.  Repeat this exercise with a slight bend in your knee. Repeat __________ times. Complete this stretch __________ times per day.  STRENGTHENING EXERCISES - Peroneal Tendinitis  These exercises may help you when beginning to rehabilitate your injury. They may resolve your symptoms with or without further involvement from your physician, physical therapist or athletic trainer. While completing these exercises, remember:   Muscles can gain both the endurance and the strength needed for everyday activities through controlled exercises.  Complete these exercises as instructed by your physician, physical therapist or athletic trainer. Increase the resistance and repetitions only as guided by your caregiver. STRENGTH - Dorsiflexors  Secure a rubber exercise band or tubing to a fixed object (table, pole) and loop the other end around your right / left foot.  Sit on the floor facing the fixed object. The band should be slightly tense when your foot is relaxed.  Slowly draw your foot back toward you, using your ankle and toes.  Hold this position for __________ seconds. Slowly release the tension in the band and return your foot to the starting position. Repeat __________ times. Complete this exercise __________ times per day.  STRENGTH - Towel Curls  Sit in a chair, on a non-carpeted surface.  Place your foot on a towel, keeping your heel on the floor.  Pull the towel toward your heel only by curling your toes. Keep your heel on the floor.  If instructed by your physician, physical therapist or athletic trainer, add weight to the end of the towel. Repeat __________ times. Complete this exercise __________ times per day. STRENGTH - Ankle Eversion   Secure one end of a rubber exercise band or tubing to a fixed object (table, pole). Loop the other end around your foot, just before your toes.  Place your fists between your knees.  This will focus your strengthening at your ankle.  Drawing the band across your opposite foot, away from the pole, slowly, pull your little toe out and up. Make sure the band is positioned to resist the entire motion.  Hold this position for __________ seconds.  Have your muscles resist the band, as it slowly pulls your foot back to the starting position. Repeat __________ times. Complete this exercise __________ times per day.  Document Released: 02/10/2005 Document Revised: 06/27/2013 Document Reviewed: 05/25/2008 [employer] Patient Information 2015 [employer]. This information is not intended to replace advice given to you by your health care provider. Make sure you discuss any questions you have with your health care provider.  

## 2013-11-27 NOTE — Progress Notes (Signed)
Patient ID: Nancy [name], female   DOB: 1945/05/13, 68 y.o.   MRN: [id number]  Subjective: Nancy [name], 68 year old female, returns the office they for followup evaluation of left foot peroneal tendinitis. She states that she was started on [facility] and she received an injection into the symptomatic area last visit. She states that she has had relief since last appointment although she still has mild discomfort at times. No acute changes since last appointment. No other complaints at this time.  Objective: AAO x3, NAD DP/PT pulses palpable b/l. CRT < 3 sec Protective sensation intact with Simms Weinstein monofilament, vibratory sensation intact, Achilles tendon reflex intact. Mild discomfort on the fifth metatarsal base and along the course of the peroneal tendons. Pain along the course of the peroneus longus tendon of both the lateral and inferior aspect of the cuboid. MMT 5/5, ROM WNL and without pain. No overlying edema. Overlying skin intact. No calf pain, swelling, warmth.  Assessment: 68 year old female with left foot peroneal tendinitis.  Plan: -Various treatment options discussed including alternatives, risks, complications. -Continue with MOBIC. -Discussed icing to the effected area. -Discussed stretching exercises for peroneal tendons. -Continue supportive shoe gear. -Followup after she returns from her vacation. Call the office sooner with any questions, concerns, change in symptoms.

## 2013-12-12 ENCOUNTER — Ambulatory Visit (INDEPENDENT_AMBULATORY_CARE_PROVIDER_SITE_OTHER): Payer: Medicare Other | Admitting: Podiatry

## 2013-12-12 ENCOUNTER — Encounter: Payer: Self-pay | Admitting: Podiatry

## 2013-12-12 VITALS — BP 113/65 | HR 70 | Resp 16

## 2013-12-12 DIAGNOSIS — M7672 Peroneal tendinitis, left leg: Secondary | ICD-10-CM

## 2013-12-12 NOTE — Progress Notes (Signed)
She presents today for followup of her peroneal tendinitis and pain about the fifth metatarsal base of the left foot. She states that her left foot is approximately 50-60% improved. It no longer hurts its just sore.  Objective: Vital signs are stable she is alert and oriented x3. Pulses are strongly palpable left foot. No erythema edema cellulitis drainage or odor. Fifth metatarsal base and peroneal tendons are easily visible and palpable without pain with exception of the proximal metaphyseal region of the fifth metatarsal base left foot. This is mildly tender on palpation.  Assessment: Insertional peroneal tendinitis left foot.  Plan: Continue meloxicam and other conservative therapies. I will followup with her in one month.

## 2013-12-26 ENCOUNTER — Encounter: Payer: Self-pay | Admitting: Podiatry

## 2014-01-09 ENCOUNTER — Ambulatory Visit: Payer: Medicare Other | Admitting: Podiatry

## 2014-01-26 ENCOUNTER — Other Ambulatory Visit: Payer: Self-pay | Admitting: Family Medicine

## 2014-01-26 NOTE — Telephone Encounter (Signed)
appt scheduled and med refilled 

## 2014-01-26 NOTE — Telephone Encounter (Signed)
Please schedule annual exam and refill until then  

## 2014-01-26 NOTE — Telephone Encounter (Signed)
Electronic refill request, no recent/future appt., please advise  

## 2014-02-20 ENCOUNTER — Other Ambulatory Visit: Payer: Self-pay | Admitting: Family Medicine

## 2014-03-06 ENCOUNTER — Encounter: Payer: Self-pay | Admitting: Family Medicine

## 2014-03-06 ENCOUNTER — Ambulatory Visit (INDEPENDENT_AMBULATORY_CARE_PROVIDER_SITE_OTHER): Payer: Medicare PPO | Admitting: Family Medicine

## 2014-03-06 ENCOUNTER — Encounter: Payer: Self-pay | Admitting: Internal Medicine

## 2014-03-06 VITALS — BP 122/70 | HR 63 | Temp 98.1°F | Ht 63.5 in | Wt 165.0 lb

## 2014-03-06 DIAGNOSIS — E785 Hyperlipidemia, unspecified: Secondary | ICD-10-CM

## 2014-03-06 DIAGNOSIS — M67431 Ganglion, right wrist: Secondary | ICD-10-CM

## 2014-03-06 DIAGNOSIS — K219 Gastro-esophageal reflux disease without esophagitis: Secondary | ICD-10-CM

## 2014-03-06 DIAGNOSIS — Z23 Encounter for immunization: Secondary | ICD-10-CM

## 2014-03-06 DIAGNOSIS — M67439 Ganglion, unspecified wrist: Secondary | ICD-10-CM | POA: Insufficient documentation

## 2014-03-06 DIAGNOSIS — Z1211 Encounter for screening for malignant neoplasm of colon: Secondary | ICD-10-CM

## 2014-03-06 DIAGNOSIS — R739 Hyperglycemia, unspecified: Secondary | ICD-10-CM

## 2014-03-06 DIAGNOSIS — E039 Hypothyroidism, unspecified: Secondary | ICD-10-CM

## 2014-03-06 DIAGNOSIS — Z0000 Encounter for general adult medical examination without abnormal findings: Secondary | ICD-10-CM

## 2014-03-06 LAB — COMPREHENSIVE METABOLIC PANEL
ALK PHOS: 70 U/L (ref 39–117)
ALT: 47 U/L — AB (ref 0–35)
AST: 28 U/L (ref 0–37)
Albumin: 4.1 g/dL (ref 3.5–5.2)
BILIRUBIN TOTAL: 0.4 mg/dL (ref 0.2–1.2)
BUN: 19 mg/dL (ref 6–23)
CHLORIDE: 104 meq/L (ref 96–112)
CO2: 26 mEq/L (ref 19–32)
Calcium: 9.5 mg/dL (ref 8.4–10.5)
Creatinine, Ser: 0.8 mg/dL (ref 0.4–1.2)
GFR: 74.63 mL/min (ref >60.00)
Glucose, Bld: 107 mg/dL — ABNORMAL HIGH (ref 70–99)
Potassium: 4.8 mEq/L (ref 3.5–5.1)
Sodium: 139 mEq/L (ref 135–145)
Total Protein: 6.9 g/dL (ref 6.0–8.3)

## 2014-03-06 LAB — LIPID PANEL
CHOLESTEROL: 228 mg/dL — AB (ref 0–200)
HDL: 51.6 mg/dL (ref >39.00)
LDL Cholesterol: 137 mg/dL — ABNORMAL HIGH (ref 0–99)
NONHDL: 176.4
Total CHOL/HDL Ratio: 4
Triglycerides: 195 mg/dL — ABNORMAL HIGH (ref 0.0–149.0)
VLDL: 39 mg/dL (ref 0.0–40.0)

## 2014-03-06 LAB — CBC WITH DIFFERENTIAL/PLATELET
BASOS ABS: 0.1 10*3/uL (ref 0.0–0.1)
Basophils Relative: 0.8 % (ref 0.0–3.0)
EOS PCT: 2.2 % (ref 0.0–5.0)
Eosinophils Absolute: 0.2 10*3/uL (ref 0.0–0.7)
HEMATOCRIT: 42.6 % (ref 36.0–46.0)
HEMOGLOBIN: 14.3 g/dL (ref 12.0–15.0)
LYMPHS PCT: 32.9 % (ref 12.0–46.0)
Lymphs Abs: 2.3 10*3/uL (ref 0.7–4.0)
MCHC: 33.6 g/dL (ref 30.0–36.0)
MCV: 95.2 fl (ref 78.0–100.0)
Monocytes Absolute: 0.4 10*3/uL (ref 0.1–1.0)
Monocytes Relative: 6.1 % (ref 3.0–12.0)
NEUTROS ABS: 4 10*3/uL (ref 1.4–7.7)
Neutrophils Relative %: 58 % (ref 43.0–77.0)
PLATELETS: 254 10*3/uL (ref 150.0–400.0)
RBC: 4.48 Mil/uL (ref 3.87–5.11)
RDW: 12.6 % (ref 11.5–15.5)
WBC: 6.9 10*3/uL (ref 4.0–10.5)

## 2014-03-06 LAB — HEMOGLOBIN A1C: Hgb A1c MFr Bld: 6.1 % (ref 4.6–6.5)

## 2014-03-06 MED ORDER — OMEPRAZOLE 40 MG PO CPDR: 40.0000 mg | DELAYED_RELEASE_CAPSULE | Freq: Every day | ORAL | Status: DC

## 2014-03-06 MED ORDER — SIMVASTATIN 10 MG PO TABS: 10.0000 mg | ORAL_TABLET | Freq: Every day | ORAL | Status: DC

## 2014-03-06 NOTE — Assessment & Plan Note (Signed)
Due for screening colonosc  Scheduled GI ref for this and to disc gerd and bloating

## 2014-03-06 NOTE — Assessment & Plan Note (Signed)
Reviewed health habits including diet and exercise and skin cancer prevention Reviewed appropriate screening tests for age  Also reviewed health mt list, fam hx and immunization status , as well as social and family history   See HPI See AVS prevnar today Pt will schedule mammogram

## 2014-03-06 NOTE — Assessment & Plan Note (Signed)
Disc goals for lipids and reasons to control them Rev labs with pt from last draw  Rev low sat fat diet in detail  Lipid panel today

## 2014-03-06 NOTE — Progress Notes (Signed)
Subjective:    Patient ID: Nancy [name], female    DOB: Feb 28, 1945, 69 y.o.   MRN: [id number]  HPI Here for annual medicare wellness visit and for acute and chronic health problems  I have personally reviewed the Medicare Annual Wellness questionnaire and have noted 1. The patient's medical and social history 2. Their use of alcohol, tobacco or illicit drugs 3. Their current medications and supplements 4. The patient's functional ability including ADL's, fall risks, home safety risks and hearing or visual             impairment. 5. Diet and physical activities 6. Evidence for depression or mood disorders  The patients weight, height, BMI have been recorded in the chart and visual acuity is per eye clinic.  I have made referrals, counseling and provided education to the patient based review of the above and I have provided the pt with a written personalized care plan for preventive services.  Wt is up 1 lb with bmi of 28  See scanned forms.  Routine anticipatory guidance given to patient.  See health maintenance. Colon cancer screening 2006- she is due for one (she also has bloating and gas and wants to see Dr Olevia Perches) Breast cancer screening 2/15 nl mammogram- coming up next mo / she will make her appt  Self breast exam- no change - fibrocystic / Dr Excell Seltzer keeps watching her  Flu vaccine 10/15 Tetanus vaccine 1/11 Pneumovax 12/14 , will do prevnar today Zoster vaccine 7/09  Advance directive - she does have one set up and also POA  Cognitive function addressed- see scanned forms- and if abnormal then additional documentation follows.  No worries about her memory   PMH and SH reviewed  Meds, vitals, and allergies reviewed.   ROS: See HPI.  Otherwise negative.    Due for labs  Hyperlipidemia on zocor Diet has been the same  Not exercising (does have a very active schedule)  Hyperglycemia Lab Results  Component Value Date   HGBA1C 5.9 01/24/2013    Had dexa -nl  in 05  Has a ganglion cyst R wrist more and more bothersome  Needs a referral to hand specialist    Patient Active Problem List   Diagnosis Date Noted  . Encounter for Medicare annual wellness exam 01/24/2013  . Positive H. pylori test 03/26/2012  . H/O abnormal Pap smear 05/06/2011  . GERD (gastroesophageal reflux disease) 07/16/2010  . BACK PAIN 09/06/2009  . NEPHROLITHIASIS, HX OF 09/06/2009  . Hyperglycemia 08/22/2009  . POSTMENOPAUSAL STATUS 08/29/2008  . Hypothyroidism 05/18/2006  . Hyperlipidemia 05/18/2006  . DIVERTICULOSIS, COLON 05/18/2006  . RENAL CALCULUS 05/18/2006  . FIBROCYSTIC BREAST DISEASE 05/18/2006  . ROSACEA 05/18/2006  . DEGENERATIVE DISC DISEASE 05/18/2006  . ALLERGY 05/18/2006   Past Medical History  Diagnosis Date  . Thyroid disease   . Diverticulosis   . Hyperlipidemia   . 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 Fibrocystic breast   . Allergy   . Nephrolithiasis     Hx of  . Abnormal Pap smear     ASCUS  . GERD (gastroesophageal reflux disease)    Past Surgical History  Procedure Laterality Date  . Tonsillectomy  1968  . Thyroidectomy  1972  . Abdominal hysterectomy  2005  . Bunionectomy  2010  . Laparoscopy      D&C  . Dilation and curettage of uterus    . Tubal ligation    . Breast cyst aspiration  06/2003    and 04/2004  . Colonoscopy  2006  . Bilateral salpingoophorectomy  2005  . Metatarsal osteotomy with bunionectomy Bilateral 2011   History  Substance Use Topics  . Smoking status: Never Smoker   . Smokeless tobacco: Never Used  . Alcohol Use: 0.0 oz/week    2-3 Glasses of wine per week     Comment: glass of wine every other night, beer once a month   Family History  Problem Relation Age of Onset  . Cancer Father     lung CA  . Cancer Sister     melanoma  . Stroke Mother    Allergies  Allergen Reactions  . Adhesive [Tape]   . Bee Venom   . Ciprofloxacin     REACTION: rash  . Latex     REACTION: rash  .  Metronidazole   . Strawberry   . Sulfa Drugs Cross Reactors   . Sulfonamide Derivatives     REACTION: rash   Current Outpatient Prescriptions on File Prior to Visit  Medication Sig Dispense Refill  . aspirin 81 MG tablet Take 81 mg by mouth daily.      . calcium-vitamin D (OSCAL WITH D) 500-200 MG-UNIT per tablet Take 1 tablet by mouth daily.    . fluticasone (FLONASE) 50 MCG/ACT nasal spray 1 spray by Nasal route daily as needed.      Marland Kitchen Ketotifen Fumarate (ZADITOR OP) Apply to eye as needed.     Marland Kitchen levothyroxine (SYNTHROID, LEVOTHROID) 88 MCG tablet Take 88 mcg by mouth daily.      . Multiple Vitamins-Minerals (CENTRUM PO) Take by mouth daily.    . Omega-3 Fatty Acids (FISH OIL) 500 MG CAPS Take 1 capsule by mouth daily.    Marland Kitchen omeprazole (PRILOSEC) 40 MG capsule TAKE ONE CAPSULE BY MOUTH DAILY 30 capsule 1  . Polyethyl Glycol-Propyl Glycol (SYSTANE) 0.4-0.3 % SOLN Apply 1 drop to eye as needed.      . Probiotic Product (PROBIOTIC DAILY PO) Take by mouth daily.    . simvastatin (ZOCOR) 10 MG tablet TAKE 1 TABLET BY MOUTH DAILY 30 tablet 0   No current facility-administered medications on file prior to visit.     Review of Systems Review of Systems  Constitutional: Negative for fever, appetite change, fatigue and unexpected weight change.  Eyes: Negative for pain and visual disturbance.  Respiratory: Negative for cough and shortness of breath.   Cardiovascular: Negative for cp or palpitations    Gastrointestinal: Negative for nausea, diarrhea and constipation.  Genitourinary: Negative for urgency and frequency.  Skin: Negative for pallor or rash   MSK pos for R wrist pain from cyst Neurological: Negative for weakness, light-headedness, numbness and headaches.  Hematological: Negative for adenopathy. Does not bruise/bleed easily.  Psychiatric/Behavioral: Negative for dysphoric mood. The patient is not nervous/anxious.         Objective:   Physical Exam  Constitutional: She  appears well-developed and well-nourished. No distress.  overwt and well appearing   HENT:  Head: Normocephalic and atraumatic.  Right Ear: External ear normal.  Left Ear: External ear normal.  Nose: Nose normal.  Mouth/Throat: Oropharynx is clear and moist.  Eyes: Conjunctivae and EOM are normal. Pupils are equal, round, and reactive to light. Right eye exhibits no discharge. Left eye exhibits no discharge. No scleral icterus.  Neck: Normal range of motion. Neck supple. No JVD present. No thyromegaly present.  Cardiovascular: Normal rate, regular rhythm, normal heart sounds and intact distal pulses.  Exam reveals no gallop.   Pulmonary/Chest: Effort normal and breath sounds normal. No respiratory distress. She has no wheezes. She has no rales.  Abdominal: Soft. Bowel sounds are normal. She exhibits no distension and no mass. There is no tenderness.  Musculoskeletal: She exhibits no edema or tenderness.  4 mm tender ganglion cyst on R wrist   Lymphadenopathy:    She has no cervical adenopathy.  Neurological: She is alert. She has normal reflexes. No cranial nerve deficit. She exhibits normal muscle tone. Coordination normal.  Skin: Skin is warm and dry. No rash noted. No erythema. No pallor.  Psychiatric: She has a normal mood and affect.          Assessment & Plan:   Problem List Items Addressed This Visit      Digestive   GERD (gastroesophageal reflux disease)    Ref to Gi- Dr Olevia Perches to discuss this and screening colonoscopy Will continue PPI    Relevant Medications      omeprazole (PRILOSEC) capsule   Other Relevant Orders      Ambulatory referral to Gastroenterology      CBC with Differential (Completed)     Endocrine   Hypothyroidism - Primary     Musculoskeletal and Integument   Ganglion cyst of wrist    R wrist- bothersome to her and enlarging  Ref to hand specialist for eval and tx     Relevant Orders      Ambulatory referral to Orthopedic Surgery     Other    Colon cancer screening    Due for screening colonosc  Scheduled GI ref for this and to disc gerd and bloating     Relevant Orders      Ambulatory referral to Gastroenterology   Encounter for Medicare annual wellness exam    Reviewed health habits including diet and exercise and skin cancer prevention Reviewed appropriate screening tests for age  Also reviewed health mt list, fam hx and immunization status , as well as social and family history   See HPI See AVS prevnar today Pt will schedule mammogram     Hyperglycemia    A1C today Disc risk of DM Disc low glycemic diet     Relevant Orders      Hemoglobin A1c (Completed)   Hyperlipidemia    Disc goals for lipids and reasons to control them Rev labs with pt from last draw  Rev low sat fat diet in detail  Lipid panel today     Relevant Medications      simvastatin (ZOCOR) tablet   Other Relevant Orders      Comprehensive metabolic panel (Completed)      Lipid panel (Completed)    Other Visit Diagnoses    Need for vaccination with 13-polyvalent pneumococcal conjugate vaccine        Relevant Orders       Pneumococcal conjugate vaccine 13-valent (Completed)

## 2014-03-06 NOTE — Patient Instructions (Signed)
Don't forget to schedule your mammogram Stop at check out for referral to GI and also hand doctor referral  Lab today prevnar vaccine today

## 2014-03-06 NOTE — Assessment & Plan Note (Signed)
R wrist- bothersome to her and enlarging  Ref to hand specialist for eval and tx

## 2014-03-06 NOTE — Assessment & Plan Note (Signed)
A1C today Disc risk of DM Disc low glycemic diet

## 2014-03-06 NOTE — Progress Notes (Signed)
Pre visit review using our clinic review tool, if applicable. No additional management support is needed unless otherwise documented below in the visit note. 

## 2014-03-06 NOTE — Assessment & Plan Note (Signed)
Ref to Gi- Dr Olevia Perches to discuss this and screening colonoscopy Will continue PPI

## 2014-03-07 ENCOUNTER — Encounter: Payer: Self-pay | Admitting: *Deleted

## 2014-03-23 ENCOUNTER — Telehealth: Payer: Self-pay | Admitting: *Deleted

## 2014-03-23 NOTE — Telephone Encounter (Signed)
Received faxed. PA for omeprazole capsule dr has been approved effective 02/07/2014 through 03/09/2015.

## 2014-04-26 ENCOUNTER — Encounter: Payer: Self-pay | Admitting: Gastroenterology

## 2014-05-03 ENCOUNTER — Other Ambulatory Visit: Payer: Self-pay

## 2014-05-03 DIAGNOSIS — Z1231 Encounter for screening mammogram for malignant neoplasm of breast: Secondary | ICD-10-CM

## 2014-05-10 ENCOUNTER — Encounter: Payer: Self-pay | Admitting: Internal Medicine

## 2014-05-10 ENCOUNTER — Ambulatory Visit (INDEPENDENT_AMBULATORY_CARE_PROVIDER_SITE_OTHER): Payer: Medicare PPO | Admitting: Internal Medicine

## 2014-05-10 ENCOUNTER — Ambulatory Visit: Payer: BC Managed Care – PPO | Admitting: Internal Medicine

## 2014-05-10 VITALS — BP 130/70 | HR 78 | Ht 63.0 in | Wt 166.8 lb

## 2014-05-10 DIAGNOSIS — R1013 Epigastric pain: Secondary | ICD-10-CM

## 2014-05-10 DIAGNOSIS — K219 Gastro-esophageal reflux disease without esophagitis: Secondary | ICD-10-CM

## 2014-05-10 MED ORDER — OMEPRAZOLE 20 MG PO CPDR: 20.0000 mg | DELAYED_RELEASE_CAPSULE | Freq: Every day | ORAL | Status: DC

## 2014-05-10 MED ORDER — MOVIPREP 100 G PO SOLR
1.0000 | Freq: Once | ORAL | Status: DC
Start: 2014-05-10 — End: 2014-05-18

## 2014-05-10 NOTE — Patient Instructions (Addendum)
Dr Joan Flores have been scheduled for an abdominal ultrasound at [facility] (1st floor of hospital) on 05/15/2014 at 7:30am. Please arrive 15 minutes prior to your appointment for registration. Make certain not to have anything to eat or drink 6 hours prior to your appointment. Should you need to reschedule your appointment, please contact radiology at [phone]. This test typically takes about 30 minutes to perform.   You have been scheduled for an endoscopy and colonoscopy. Please follow the written instructions given to you at your visit today. Please pick up your prep supplies at the pharmacy within the next 1-3 days. If you use inhalers (even only as needed), please bring them with you on the day of your procedure. Your physician has requested that you go to www.startemmi.com and enter the access code given to you at your visit today. This web site gives a general overview about your procedure. However, you should still follow specific instructions given to you by our office regarding your preparation for the procedure.  We have sent the following medications to your pharmacy for you to pick up at your convenience:  Omeprazole 20 - take one by mouth at night  You may take Gaviscon as needed for bloating and indigestion

## 2014-05-10 NOTE — Progress Notes (Signed)
Nancy [name] [birth year] [id number]  Note: This dictation was prepared with Dragon digital system. Any transcriptional errors that result from this procedure are unintentional.   History of Present Illness: This is a 69 year old white female with bloating, dyspepsia and increased gastroesophageal reflux refractory to omeprazole 40 mg every morning. She is here to discuss possible upper endoscopy. She is due for screening colonoscopy which was for the first time done in January 2006 and she had diverticulosis. She since then has had an episode  of diverticulitis but currently is doing well with having regular bowel movements. There is no family history of gallbladder disease. She denies regurgitation of the food or hoarseness. She has started probiotic which seems to help with bloating. She was  evaluated 2 years ago for chest pain and what was found to be likely related to reflux. Chest pains have often been precipitated  by ice cold sodas    Past Medical History  Diagnosis Date  . Hypothyroidism   . Diverticulosis   . Hyperlipidemia   . 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 Fibrocystic breast   . Allergy   . Nephrolithiasis     Hx of  . Abnormal Pap smear     ASCUS  . GERD (gastroesophageal reflux disease)   . Rosacea   . DDD (degenerative disc disease), lumbar   . Positive H. pylori test   . Ganglion cyst of wrist     Past Surgical History  Procedure Laterality Date  . Tonsillectomy  1968  . Thyroidectomy  1972  . Abdominal hysterectomy  2005  . Bunionectomy  2010  . Laparoscopy      D&C  . Dilation and curettage of uterus    . Tubal ligation    . Breast cyst aspiration  06/2003    and 04/2004  . Colonoscopy  2006  . Bilateral salpingoophorectomy  2005  . Metatarsal osteotomy with bunionectomy Bilateral 2011    Allergies  Allergen Reactions  . Adhesive [Tape]   . Bee Venom   . Ciprofloxacin     REACTION: rash  . Latex     REACTION: rash  . Metronidazole   .  Strawberry   . Sulfa Drugs Cross Reactors   . Sulfonamide Derivatives     REACTION: rash    Family history and social history have been reviewed.  Review of Systems: Occasional globus sensation. Weight has been stable. Normal bowel habits  The remainder of the 10 point ROS is negative except as outlined in the H&P  Physical Exam: General Appearance Well developed, in no distress Eyes  Non icteric  HEENT  Non traumatic, normocephalic  Mouth No lesion, tongue papillated, no cheilosis Neck Supple without adenopathy, thyroid not enlarged, no carotid bruits, no JVD Lungs Clear to auscultation bilaterally COR Normal S1, normal S2, regular rhythm, no murmur, quiet precordium Abdomen epigastric tenderness. Normoactive bowel sounds. Liver edge at costal margin  Rectal not done Extremities  No pedal edema Skin No lesions Neurological Alert and oriented x 3 Psychological Normal mood and affect  Assessment and Plan:   69 year old white female with dyspepsia and bloating. Progressive gastroesophageal reflux refractory to PPI. We will schedule upper endoscopy. Her 2 sisters have hiatal hernia and she wonders if she also has a hiatal hernia. She will take proton Prilosec 40 mg in the morning and 20 g at night. She will also use Gaviscon on a when necessary basis  Colorectal screening. Last colonoscopy January 2006. We will  go ahead and schedule screening colonoscopy  Dyspepsia and bloating. We will check upper abdominal ultrasound   Delfin Edis 05/10/2014

## 2014-05-15 ENCOUNTER — Ambulatory Visit (HOSPITAL_COMMUNITY)
Admission: RE | Admit: 2014-05-15 | Discharge: 2014-05-15 | Disposition: A | Discharge status: Home / Self Care | Payer: Medicare PPO | Source: Ambulatory Visit | Attending: Internal Medicine | Admitting: Internal Medicine

## 2014-05-15 ENCOUNTER — Ambulatory Visit
Admission: RE | Admit: 2014-05-15 | Discharge: 2014-05-15 | Disposition: A | Discharge status: Home / Self Care | Payer: Medicare PPO | Source: Ambulatory Visit

## 2014-05-15 DIAGNOSIS — Z1231 Encounter for screening mammogram for malignant neoplasm of breast: Secondary | ICD-10-CM

## 2014-05-15 DIAGNOSIS — R1013 Epigastric pain: Secondary | ICD-10-CM | POA: Insufficient documentation

## 2014-05-15 DIAGNOSIS — K219 Gastro-esophageal reflux disease without esophagitis: Secondary | ICD-10-CM | POA: Insufficient documentation

## 2014-05-17 ENCOUNTER — Telehealth: Payer: Self-pay

## 2014-05-17 NOTE — Telephone Encounter (Signed)
-----   Message from Abner Greenspan, MD sent at 05/16/2014  5:24 PM EDT ----- Mammogram is normal  Please note for flow sheet if you can  Due for next screening mammogram in 1 year

## 2014-05-17 NOTE — Telephone Encounter (Signed)
Patient aware of normal mammogram and recommendations.

## 2014-05-18 ENCOUNTER — Ambulatory Visit (AMBULATORY_SURGERY_CENTER): Payer: Medicare PPO | Admitting: Internal Medicine

## 2014-05-18 ENCOUNTER — Encounter: Payer: Self-pay | Admitting: Internal Medicine

## 2014-05-18 VITALS — BP 130/96 | HR 70 | Temp 98.1°F | Resp 18 | Ht 63.0 in | Wt 166.0 lb

## 2014-05-18 DIAGNOSIS — K219 Gastro-esophageal reflux disease without esophagitis: Secondary | ICD-10-CM

## 2014-05-18 DIAGNOSIS — B9681 Helicobacter pylori [H. pylori] as the cause of diseases classified elsewhere: Secondary | ICD-10-CM

## 2014-05-18 DIAGNOSIS — Z1211 Encounter for screening for malignant neoplasm of colon: Secondary | ICD-10-CM

## 2014-05-18 DIAGNOSIS — A048 Other specified bacterial intestinal infections: Secondary | ICD-10-CM

## 2014-05-18 MED ORDER — SODIUM CHLORIDE 0.9 % IV SOLN: 500.0000 mL | INTRAVENOUS | Status: DC

## 2014-05-18 NOTE — Op Note (Signed)
[facility]  [address] [place], [address]   COLONOSCOPY PROCEDURE REPORT  PATIENT: Nancy [name], Nancy [name]  MR#: [id number] BIRTHDATE: 09-13-1945 , 68  yrs. old GENDER: female ENDOSCOPIST: Lafayette Dragon, MD REFERRED FM:[id number] Vernell Morgans, M.D. PROCEDURE DATE:  05/18/2014 PROCEDURE:   Colonoscopy, screening First Screening Colonoscopy - Avg.  risk and is 50 yrs.  old or older - No.  Prior Negative Screening - Now for repeat screening. 10 or more years since last screening  History of Adenoma - Now for follow-up colonoscopy & has been > or = to 3 yrs.  N/A ASA CLASS:   Class II INDICATIONS:Screening for colonic neoplasia and Colorectal Neoplasm Risk Assessment for this procedure is average risk. MEDICATIONS: Monitored anesthesia care and Propofol 150 mg IV  DESCRIPTION OF PROCEDURE:   After the risks benefits and alternatives of the procedure were thoroughly explained, informed consent was obtained.  The digital rectal exam revealed no abnormalities of the rectum.   The LB [id number]  endoscope was introduced through the anus and advanced to the cecum, which was identified by both the appendix and ileocecal valve. No adverse events experienced.   The quality of the prep was good.  (MoviPrep was used)  The instrument was then slowly withdrawn as the colon was fully examined.      COLON FINDINGS: There was moderate diverticulosis noted throughout the entire examined colon with associated angulation and tortuosity.  Retroflexed views revealed no abnormalities. The time to cecum = 7.26 Withdrawal time = 7.16   The scope was withdrawn and the procedure completed. COMPLICATIONS: There were no immediate complications.  ENDOSCOPIC IMPRESSION: There was moderate diverticulosis noted throughout the entire examined colon  RECOMMENDATIONS: High fiber diet Recall colonoscopy in 10 years  eSigned:  Lafayette Dragon, MD 05/18/2014 8:16 AM   cc:   PATIENT NAME:   Ally, Knodel MR#: [id number]

## 2014-05-18 NOTE — Progress Notes (Signed)
Report to PACU, RN, vss, BBS= Clear.  

## 2014-05-18 NOTE — Patient Instructions (Addendum)
YOU HAD AN ENDOSCOPIC PROCEDURE TODAY AT THE [facility]:   Refer to the procedure report that was given to you for any specific questions about what was found during the examination.  If the procedure report does not answer your questions, please call your gastroenterologist to clarify.  If you requested that your care partner not be given the details of your procedure findings, then the procedure report has been included in a sealed envelope for you to review at your convenience later.  YOU SHOULD EXPECT: Some feelings of bloating in the abdomen. Passage of more gas than usual.  Walking can help get rid of the air that was put into your GI tract during the procedure and reduce the bloating. If you had a lower endoscopy (such as a colonoscopy or flexible sigmoidoscopy) you may notice spotting of blood in your stool or on the toilet paper. If you underwent a bowel prep for your procedure, you may not have a normal bowel movement for a few days.  Please Note:  You might notice some irritation and congestion in your nose or some drainage.  This is from the oxygen used during your procedure.  There is no need for concern and it should clear up in a day or so.  SYMPTOMS TO REPORT IMMEDIATELY:   Following lower endoscopy (colonoscopy or flexible sigmoidoscopy):  Excessive amounts of blood in the stool  Significant tenderness or worsening of abdominal pains  Swelling of the abdomen that is new, acute  Fever of 100F or higher   Following upper endoscopy (EGD)  Vomiting of blood or coffee ground material  New chest pain or pain under the shoulder blades  Painful or persistently difficult swallowing  New shortness of breath  Fever of 100F or higher  Black, tarry-looking stools  For urgent or emergent issues, a gastroenterologist can be reached at any hour by calling [phone].   DIET: Your first meal following the procedure should be a small meal and then it is ok to progress to  your normal diet. Heavy or fried foods are harder to digest and may make you feel nauseous or bloated.  Likewise, meals heavy in dairy and vegetables can increase bloating.  Drink plenty of fluids but you should avoid alcoholic beverages for 24 hours.  ACTIVITY:  You should plan to take it easy for the rest of today and you should NOT DRIVE or use heavy machinery until tomorrow (because of the sedation medicines used during the test).    FOLLOW UP: Our staff will call the number listed on your records the next business day following your procedure to check on you and address any questions or concerns that you may have regarding the information given to you following your procedure. If we do not reach you, we will leave a message.  However, if you are feeling well and you are not experiencing any problems, there is no need to return our call.  We will assume that you have returned to your regular daily activities without incident.  If any biopsies were taken you will be contacted by phone or by letter within the next 1-3 weeks.  Please call us at [phone] if you have not heard about the biopsies in 3 weeks.    SIGNATURES/CONFIDENTIALITY: You and/or your care partner have signed paperwork which will be entered into your electronic medical record.  These signatures attest to the fact that that the information above on your After Visit Summary has been reviewed   and is understood.  Full responsibility of the confidentiality of this discharge information lies with you and/or your care-partner.     Handouts were given to your care partner on diverticulosis,a high fiber diet with liberal fluid intake and gastritis. You might notice some irritation in your nose or drainage.  This may cause feelings of congestion.  This is from the oxygen, which can be drying.  This is no cause for concern; this should clear up in a few days.  You may resume your current medications today.  Per Dr. Olevia Perches continue  prilosec 40 mg in the am and prilosec 20 mg at bedtime.  Use Gaviscon 1-2 tabs daily 2- 3 hours when necessary for indigestion. Await biopsy results. Please call if any questions or concerns.

## 2014-05-18 NOTE — Progress Notes (Signed)
Called to room to assist during endoscopic procedure.  Patient ID and intended procedure confirmed with present staff. Received instructions for my participation in the procedure from the performing physician.  

## 2014-05-18 NOTE — Op Note (Signed)
[facility]  [address]. [place], [address]   ENDOSCOPY PROCEDURE REPORT  PATIENT: [name], Nancy  MR#: [id number] BIRTHDATE: [birth year] , 68  yrs. old GENDER: female ENDOSCOPIST: Lafayette Dragon, MD REFERRED BY:  Abner Greenspan, M.D. PROCEDURE DATE:  05/18/2014 PROCEDURE:  EGD w/ biopsy ASA CLASS:     Class II INDICATIONS:  dyspepsia and Gastroesophageal reflux.  Refractory to PPIs.  Upper abdominal ultrasound negative, history of positive H. pylori antibody, treated. MEDICATIONS: Monitored anesthesia care and Propofol 200 mg IV TOPICAL ANESTHETIC: none  DESCRIPTION OF PROCEDURE: After the risks benefits and alternatives of the procedure were thoroughly explained, informed consent was obtained.  The LB [id number] endoscope was introduced through the mouth and advanced to the second portion of the duodenum , Without limitations.  The instrument was slowly withdrawn as the mucosa was fully examined.    Esophagua: esophagus was intubated without difficulty. Mucosa in the proximal, mid and distal esophagus. Normal. There was no esophagitis. G-E  junction contained  one short tongue of gastric mucosa which was biopsied to rule out Barrett's esophagus. There was no stricture or hiatal hernia[  stomach: stomach was insufflated with air and sure normal rugal pattern. Gastric antrum showed mild erythema. Biopsies were also obtained to rule out H. pylori. Gastric outlet was normal. Retroflexion of the endoscope revealed normal fundus and cardia Duodenum: Duodenal wall and descending duodenum was normal The scope was then withdrawn from the patient and the procedure completed.  COMPLICATIONS: There were no immediate complications.  ENDOSCOPIC IMPRESSION: Essentially normal upper endoscopy of esophagus stomach and duodenum with a minimal antral gastritis. Biopsies obtained from GE junction as well as from gastric antrum     RECOMMENDATIONS: 1.  Await  pathology results 2.  Antireflux measures Prilosec 40 mg a.m and 20 mg at bedtime Use Gaviscon 1-2 tablets daily to 3 hours when necessary indigestion   REPEAT EXAM: for EGD pending biopsy results.  eSigned:  Lafayette Dragon, MD 05/18/2014 8:13 AM    CC:  PATIENT NAME:  Nancy [name], Nancy [name] MR#: [id number]

## 2014-05-18 NOTE — Progress Notes (Signed)
No problems noted in the recovery room. maw 

## 2014-05-22 ENCOUNTER — Telehealth: Payer: Self-pay

## 2014-05-22 NOTE — Telephone Encounter (Signed)
  Follow up Call-  Call back number 05/18/2014  Post procedure Call Back phone  # [phone]  Permission to leave phone message Yes     Patient questions:  Do you have a fever, pain , or abdominal swelling? No. Pain Score  0 *  Have you tolerated food without any problems? Yes.    Have you been able to return to your normal activities? Yes.    Do you have any questions about your discharge instructions: Diet   No. Medications  No. Follow up visit  No.  Do you have questions or concerns about your Care? No.  Actions: * If pain score is 4 or above: No action needed, pain <4.

## 2014-05-25 ENCOUNTER — Encounter: Payer: Self-pay | Admitting: Internal Medicine

## 2014-06-02 ENCOUNTER — Telehealth: Payer: Self-pay | Admitting: Internal Medicine

## 2014-06-02 NOTE — Telephone Encounter (Signed)
Discussed biopsy results letter, negative h-pylori test and Barrett's esophagus. She will continue her medications as ordered. She will contact us if she abdominal pain or other GI symptoms that cause her concern.

## 2014-06-26 ENCOUNTER — Telehealth: Payer: Self-pay | Admitting: Internal Medicine

## 2014-06-26 NOTE — Telephone Encounter (Signed)
Spoke with patient and she states she is having stomach aches. She states it happens within a couple hours after taking Omeprazole. Her pharmacist's told her to call because the high does Omeprazole can cause this. She takes Omeprazole 40 mg in AM and 20 mg in PM. She is going to hold the PM dose to night to see if this helps. Please,advise.

## 2014-06-26 NOTE — Telephone Encounter (Signed)
Spoke with patient and she wants to try cutting back the Prilosec by dropping the night dose first. She will try Ranitidine if this does not help or if reflux returns.

## 2014-06-26 NOTE — Telephone Encounter (Signed)
I agree with reducing the Prilosec. As an alternative, she can substitute OTC Ranitidine in place of one of the Prilosecs ( 40 mg)/

## 2014-07-05 ENCOUNTER — Other Ambulatory Visit (INDEPENDENT_AMBULATORY_CARE_PROVIDER_SITE_OTHER): Payer: Self-pay

## 2014-07-05 DIAGNOSIS — N6012 Diffuse cystic mastopathy of left breast: Secondary | ICD-10-CM

## 2014-07-05 DIAGNOSIS — R922 Inconclusive mammogram: Secondary | ICD-10-CM

## 2014-07-27 ENCOUNTER — Ambulatory Visit
Admission: RE | Admit: 2014-07-27 | Discharge: 2014-07-27 | Disposition: A | Discharge status: Home / Self Care | Payer: Medicare PPO | Source: Ambulatory Visit | Attending: General Surgery | Admitting: General Surgery

## 2014-07-27 MED ORDER — GADOBENATE DIMEGLUMINE 529 MG/ML IV SOLN
15.0000 mL | Freq: Once | INTRAVENOUS | Status: AC | PRN
  Administered 2014-07-27: 15 mL via INTRAVENOUS

## 2014-07-31 ENCOUNTER — Other Ambulatory Visit: Payer: Self-pay

## 2014-07-31 DIAGNOSIS — N6320 Unspecified lump in the left breast, unspecified quadrant: Secondary | ICD-10-CM

## 2014-08-01 ENCOUNTER — Other Ambulatory Visit: Payer: Self-pay | Admitting: General Surgery

## 2014-08-01 DIAGNOSIS — R928 Other abnormal and inconclusive findings on diagnostic imaging of breast: Secondary | ICD-10-CM

## 2014-08-07 ENCOUNTER — Ambulatory Visit
Admission: RE | Admit: 2014-08-07 | Discharge: 2014-08-07 | Disposition: A | Discharge status: Home / Self Care | Payer: Medicare PPO | Source: Ambulatory Visit | Attending: General Surgery | Admitting: General Surgery

## 2014-08-07 DIAGNOSIS — R928 Other abnormal and inconclusive findings on diagnostic imaging of breast: Secondary | ICD-10-CM

## 2014-08-07 MED ORDER — GADOBENATE DIMEGLUMINE 529 MG/ML IV SOLN
15.0000 mL | Freq: Once | INTRAVENOUS | Status: AC | PRN
  Administered 2014-08-07: 15 mL via INTRAVENOUS

## 2014-08-18 ENCOUNTER — Other Ambulatory Visit: Payer: Self-pay | Admitting: General Surgery

## 2014-08-21 ENCOUNTER — Telehealth: Payer: Self-pay | Admitting: Family Medicine

## 2014-08-21 DIAGNOSIS — R739 Hyperglycemia, unspecified: Secondary | ICD-10-CM

## 2014-08-21 DIAGNOSIS — E785 Hyperlipidemia, unspecified: Secondary | ICD-10-CM

## 2014-08-21 NOTE — Telephone Encounter (Signed)
-----   Message from Ellamae Sia sent at 08/11/2014 12:40 PM EDT ----- Regarding: Lab orders for Tuesday, 6.28.16  "Happy Bday, Dr Glori Bickers" Lab orders for a f/u

## 2014-08-22 ENCOUNTER — Other Ambulatory Visit (INDEPENDENT_AMBULATORY_CARE_PROVIDER_SITE_OTHER): Payer: Medicare PPO

## 2014-08-22 ENCOUNTER — Other Ambulatory Visit: Payer: Self-pay | Admitting: Surgery

## 2014-08-22 DIAGNOSIS — R739 Hyperglycemia, unspecified: Secondary | ICD-10-CM | POA: Diagnosis not present

## 2014-08-22 DIAGNOSIS — E785 Hyperlipidemia, unspecified: Secondary | ICD-10-CM

## 2014-08-22 DIAGNOSIS — N6489 Other specified disorders of breast: Secondary | ICD-10-CM

## 2014-08-22 LAB — LIPID PANEL
CHOLESTEROL: 217 mg/dL — AB (ref 0–200)
HDL: 50.1 mg/dL (ref >39.00)
LDL Cholesterol: 131 mg/dL — ABNORMAL HIGH (ref 0–99)
NonHDL: 166.9
Total CHOL/HDL Ratio: 4
Triglycerides: 181 mg/dL — ABNORMAL HIGH (ref 0.0–149.0)
VLDL: 36.2 mg/dL (ref 0.0–40.0)

## 2014-08-22 LAB — HEMOGLOBIN A1C: HEMOGLOBIN A1C: 5.6 % (ref 4.6–6.5)

## 2014-08-22 LAB — AST: AST: 29 U/L (ref 0–37)

## 2014-08-22 LAB — ALT: ALT: 38 U/L — AB (ref 0–35)

## 2014-08-31 ENCOUNTER — Encounter (HOSPITAL_BASED_OUTPATIENT_CLINIC_OR_DEPARTMENT_OTHER): Payer: Self-pay | Admitting: *Deleted

## 2014-09-01 ENCOUNTER — Encounter: Payer: Self-pay | Admitting: Family Medicine

## 2014-09-01 ENCOUNTER — Ambulatory Visit
Admission: RE | Admit: 2014-09-01 | Discharge: 2014-09-01 | Disposition: A | Discharge status: Home / Self Care | Payer: Medicare PPO | Source: Ambulatory Visit | Attending: Surgery | Admitting: Surgery

## 2014-09-01 ENCOUNTER — Ambulatory Visit (INDEPENDENT_AMBULATORY_CARE_PROVIDER_SITE_OTHER): Payer: Medicare PPO | Admitting: Family Medicine

## 2014-09-01 VITALS — BP 130/82 | HR 73 | Temp 98.1°F | Ht 63.5 in | Wt 162.5 lb

## 2014-09-01 DIAGNOSIS — R739 Hyperglycemia, unspecified: Secondary | ICD-10-CM

## 2014-09-01 DIAGNOSIS — N6489 Other specified disorders of breast: Secondary | ICD-10-CM

## 2014-09-01 DIAGNOSIS — E785 Hyperlipidemia, unspecified: Secondary | ICD-10-CM

## 2014-09-01 NOTE — Progress Notes (Signed)
Subjective:    Patient ID: Nancy [name], female    DOB: [birth year], 69 y.o.   MRN: [id number]  HPI Here for f/u of chronic medical problems   Planning a lumpectomy on Tuesday [facility]) Found a different place on MRI - atypical area - on bx  Suspects that is all she will need -hopefully   Also dx with barretts esophagus on last endoscopy -will repeat in 2 y   Wt is down 1 lb with bmi of 28 She does watch her diet , eats lean meat and salad  Does chair yoga and she loves it    Hyperlipidemia Low dose zocor (10 mg) and diet Lab Results  Component Value Date   CHOL 217* 08/22/2014   CHOL 228* 03/06/2014   CHOL 176 01/24/2013   Lab Results  Component Value Date   HDL 50.10 08/22/2014   HDL 51.60 03/06/2014   HDL 52.90 01/24/2013   Lab Results  Component Value Date   LDLCALC 131* 08/22/2014   LDLCALC 137* 03/06/2014   LDLCALC 93 01/24/2013   Lab Results  Component Value Date   TRIG 181.0* 08/22/2014   TRIG 195.0* 03/06/2014   TRIG 150.0* 01/24/2013   Lab Results  Component Value Date   CHOLHDL 4 08/22/2014   CHOLHDL 4 03/06/2014   CHOLHDL 3 01/24/2013   Lab Results  Component Value Date   LDLDIRECT 162.3 11/20/2008   she would prefer not to inc her statin -her diet is very good and she will continue to work on that   Hyperglycemia Lab Results  Component Value Date   HGBA1C 5.6 08/22/2014  this is down from 6.1   LFT - her ALT is down also to 38 from 47   BP Readings from Last 3 Encounters:  09/01/14 130/82  05/18/14 130/96  05/10/14 130/70    Patient Active Problem List   Diagnosis Date Noted  . Colon cancer screening 03/06/2014  . Ganglion cyst of wrist 03/06/2014  . Encounter for Medicare annual wellness exam 01/24/2013  . Positive H. pylori test 03/26/2012  . H/O abnormal Pap smear 05/06/2011  . GERD (gastroesophageal reflux disease) 07/16/2010  . BACK PAIN 09/06/2009  . NEPHROLITHIASIS, HX OF 09/06/2009  . Hyperglycemia 08/22/2009  .  POSTMENOPAUSAL STATUS 08/29/2008  . Hypothyroidism 05/18/2006  . Hyperlipidemia 05/18/2006  . DIVERTICULOSIS, COLON 05/18/2006  . RENAL CALCULUS 05/18/2006  . FIBROCYSTIC BREAST DISEASE 05/18/2006  . ROSACEA 05/18/2006  . DEGENERATIVE DISC DISEASE 05/18/2006  . ALLERGY 05/18/2006   Past Medical History  Diagnosis Date  . Hypothyroidism   . Diverticulosis   . Hyperlipidemia   . 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 Fibrocystic breast   . Allergy   . Nephrolithiasis     Hx of  . Abnormal Pap smear     ASCUS  . GERD (gastroesophageal reflux disease)   . Rosacea   . DDD (degenerative disc disease), lumbar   . Positive H. pylori test   . Ganglion cyst of wrist   . Cataract   . Esophagus, Barrett's    Past Surgical History  Procedure Laterality Date  . Tonsillectomy  1968  . Thyroidectomy  1972  . Abdominal hysterectomy  2005  . Bunionectomy  2010  . Laparoscopy      D&C  . Dilation and curettage of uterus    . Tubal ligation    . Breast cyst aspiration  06/2003    and 04/2004  . Colonoscopy  2006  .  Bilateral salpingoophorectomy  2005  . Metatarsal osteotomy with bunionectomy Bilateral 2011  . Right breast lumpectomy    . Left breast lumpectomy     History  Substance Use Topics  . Smoking status: Never Smoker   . Smokeless tobacco: Never Used  . Alcohol Use: 0.0 oz/week    2-3 Glasses of wine per week     Comment: glass of wine every other night, beer once a month   Family History  Problem Relation Age of Onset  . Lung cancer Father   . Melanoma Sister   . Stroke Mother   . Colon cancer Neg Hx   . Stomach cancer Neg Hx    Allergies  Allergen Reactions  . Adhesive [Tape]   . Bee Venom   . Ciprofloxacin     REACTION: rash  . Latex     REACTION: rash  . Strawberry Hives  . Sulfa Drugs Cross Reactors Diarrhea  . Sulfonamide Derivatives     REACTION: rash  . Metronidazole Rash   Current Outpatient Prescriptions on File Prior to Visit  Medication Sig  Dispense Refill  . calcium-vitamin D (OSCAL WITH D) 500-200 MG-UNIT per tablet Take 1 tablet by mouth daily.    . fluticasone (FLONASE) 50 MCG/ACT nasal spray 1 spray by Nasal route daily as needed.      Marland Kitchen Ketotifen Fumarate (ZADITOR OP) Apply to eye as needed.     Marland Kitchen levothyroxine (SYNTHROID, LEVOTHROID) 88 MCG tablet Take 88 mcg by mouth daily.      . Multiple Vitamins-Minerals (CENTRUM PO) Take by mouth daily.    . Omega-3 Fatty Acids (FISH OIL) 500 MG CAPS Take 1 capsule by mouth daily.    Marland Kitchen omeprazole (PRILOSEC) 40 MG capsule Take 1 capsule (40 mg total) by mouth daily. 30 capsule 11  . Polyethyl Glycol-Propyl Glycol (SYSTANE) 0.4-0.3 % SOLN Apply 1 drop to eye as needed.      . Probiotic Product (PROBIOTIC DAILY PO) Take by mouth daily.    . simvastatin (ZOCOR) 10 MG tablet Take 1 tablet (10 mg total) by mouth daily. 30 tablet 11  . aspirin 81 MG tablet Take 81 mg by mouth daily.       No current facility-administered medications on file prior to visit.    Review of Systems Review of Systems  Constitutional: Negative for fever, appetite change, fatigue and unexpected weight change.  Eyes: Negative for pain and visual disturbance.  Respiratory: Negative for cough and shortness of breath.   Cardiovascular: Negative for cp or palpitations    Gastrointestinal: Negative for nausea, diarrhea and constipation.  Genitourinary: Negative for urgency and frequency.  Skin: Negative for pallor or rash   Neurological: Negative for weakness, light-headedness, numbness and headaches.  Hematological: Negative for adenopathy. Does not bruise/bleed easily.  Psychiatric/Behavioral: Negative for dysphoric mood. The patient is not nervous/anxious.         Objective:   Physical Exam  Constitutional: She appears well-developed and well-nourished. No distress.  overwt and well appearing   HENT:  Head: Normocephalic and atraumatic.  Mouth/Throat: Oropharynx is clear and moist.  Eyes: Conjunctivae and  EOM are normal. Pupils are equal, round, and reactive to light.  Neck: Normal range of motion. Neck supple. No JVD present. Carotid bruit is not present. No thyromegaly present.  Cardiovascular: Normal rate, regular rhythm, normal heart sounds and intact distal pulses.  Exam reveals no gallop.   Pulmonary/Chest: Effort normal and breath sounds normal. No respiratory distress. She has no  wheezes. She has no rales.  No crackles  Abdominal: Soft. Bowel sounds are normal. She exhibits no distension, no abdominal bruit and no mass. There is no tenderness.  Musculoskeletal: She exhibits no edema.  Lymphadenopathy:    She has no cervical adenopathy.  Neurological: She is alert. She has normal reflexes.  Skin: Skin is warm and dry. No rash noted.  Psychiatric: She has a normal mood and affect.  Nursing note and vitals reviewed.         Assessment & Plan:   Problem List Items Addressed This Visit    Hyperglycemia    Improved Lab Results  Component Value Date   HGBA1C 5.6 08/22/2014   Urged to keep up the good work with low glycemic diet and wt loss       Hyperlipidemia - Primary    LDL is not quite at goal but pt is resistant to inc in statin  Disc goals for lipids and reasons to control them Rev labs with pt Rev low sat fat diet in detail  F/u 6 mo

## 2014-09-01 NOTE — Assessment & Plan Note (Signed)
LDL is not quite at goal but pt is resistant to inc in statin  Disc goals for lipids and reasons to control them Rev labs with pt Rev low sat fat diet in detail  F/u 6 mo

## 2014-09-01 NOTE — Assessment & Plan Note (Signed)
Improved Lab Results  Component Value Date   HGBA1C 5.6 08/22/2014   Urged to keep up the good work with low glycemic diet and wt loss

## 2014-09-01 NOTE — Patient Instructions (Signed)
For cholesterol (Avoid red meat/ fried foods/ egg yolks/ fatty breakfast meats/ butter, cheese and high fat dairy/ and shellfish)  If you are ever open to increasing statin dose we can discuss it  Glucose control is better   Follow up in 6 months annual exam with lab prior

## 2014-09-01 NOTE — Progress Notes (Signed)
Pre visit review using our clinic review tool, if applicable. No additional management support is needed unless otherwise documented below in the visit note. 

## 2014-09-05 ENCOUNTER — Encounter (HOSPITAL_BASED_OUTPATIENT_CLINIC_OR_DEPARTMENT_OTHER)
Admission: RE | Disposition: A | Discharge status: Home / Self Care | Payer: Self-pay | Source: Ambulatory Visit | Attending: General Surgery

## 2014-09-05 ENCOUNTER — Ambulatory Visit (HOSPITAL_BASED_OUTPATIENT_CLINIC_OR_DEPARTMENT_OTHER)
Admission: RE | Admit: 2014-09-05 | Discharge: 2014-09-05 | Disposition: A | Discharge status: Home / Self Care | Payer: Medicare PPO | Source: Ambulatory Visit | Attending: General Surgery | Admitting: General Surgery

## 2014-09-05 ENCOUNTER — Ambulatory Visit
Admission: RE | Admit: 2014-09-05 | Discharge: 2014-09-05 | Disposition: A | Discharge status: Home / Self Care | Payer: Medicare PPO | Source: Ambulatory Visit | Attending: General Surgery | Admitting: General Surgery

## 2014-09-05 ENCOUNTER — Ambulatory Visit (HOSPITAL_BASED_OUTPATIENT_CLINIC_OR_DEPARTMENT_OTHER): Payer: Medicare PPO | Admitting: Anesthesiology

## 2014-09-05 ENCOUNTER — Encounter (HOSPITAL_BASED_OUTPATIENT_CLINIC_OR_DEPARTMENT_OTHER): Payer: Self-pay

## 2014-09-05 DIAGNOSIS — E039 Hypothyroidism, unspecified: Secondary | ICD-10-CM | POA: Diagnosis not present

## 2014-09-05 DIAGNOSIS — D0502 Lobular carcinoma in situ of left breast: Secondary | ICD-10-CM | POA: Diagnosis not present

## 2014-09-05 DIAGNOSIS — R922 Inconclusive mammogram: Secondary | ICD-10-CM | POA: Insufficient documentation

## 2014-09-05 DIAGNOSIS — N6320 Unspecified lump in the left breast, unspecified quadrant: Secondary | ICD-10-CM

## 2014-09-05 DIAGNOSIS — N6012 Diffuse cystic mastopathy of left breast: Secondary | ICD-10-CM | POA: Insufficient documentation

## 2014-09-05 DIAGNOSIS — Z9102 Food additives allergy status: Secondary | ICD-10-CM | POA: Insufficient documentation

## 2014-09-05 DIAGNOSIS — K219 Gastro-esophageal reflux disease without esophagitis: Secondary | ICD-10-CM | POA: Diagnosis not present

## 2014-09-05 DIAGNOSIS — N6022 Fibroadenosis of left breast: Secondary | ICD-10-CM | POA: Insufficient documentation

## 2014-09-05 DIAGNOSIS — N6489 Other specified disorders of breast: Secondary | ICD-10-CM | POA: Diagnosis not present

## 2014-09-05 DIAGNOSIS — Z91018 Allergy to other foods: Secondary | ICD-10-CM | POA: Diagnosis not present

## 2014-09-05 DIAGNOSIS — Z881 Allergy status to other antibiotic agents status: Secondary | ICD-10-CM | POA: Insufficient documentation

## 2014-09-05 DIAGNOSIS — E780 Pure hypercholesterolemia: Secondary | ICD-10-CM | POA: Diagnosis not present

## 2014-09-05 DIAGNOSIS — Z882 Allergy status to sulfonamides status: Secondary | ICD-10-CM | POA: Insufficient documentation

## 2014-09-05 HISTORY — DX: Barrett's esophagus without dysplasia: K22.70

## 2014-09-05 HISTORY — PX: BREAST LUMPECTOMY WITH RADIOACTIVE SEED LOCALIZATION: SHX6424

## 2014-09-05 LAB — POCT HEMOGLOBIN-HEMACUE: HEMOGLOBIN: 15.4 g/dL — AB (ref 12.0–15.0)

## 2014-09-05 SURGERY — BREAST LUMPECTOMY WITH RADIOACTIVE SEED LOCALIZATION
Anesthesia: General | Laterality: Left

## 2014-09-05 MED ORDER — MIDAZOLAM HCL 2 MG/2ML IJ SOLN
INTRAMUSCULAR | Status: AC
  Filled 2014-09-05: qty 2

## 2014-09-05 MED ORDER — HYDROCODONE-ACETAMINOPHEN 5-325 MG PO TABS: 1.0000 | ORAL_TABLET | ORAL | Status: DC | PRN

## 2014-09-05 MED ORDER — ONDANSETRON HCL 4 MG/2ML IJ SOLN: 4.0000 mg | Freq: Once | INTRAMUSCULAR | Status: DC | PRN

## 2014-09-05 MED ORDER — FENTANYL CITRATE (PF) 100 MCG/2ML IJ SOLN
INTRAMUSCULAR | Status: AC
  Filled 2014-09-05: qty 4

## 2014-09-05 MED ORDER — SCOPOLAMINE 1 MG/3DAYS TD PT72
1.0000 | MEDICATED_PATCH | Freq: Once | TRANSDERMAL | Status: AC | PRN
  Administered 2014-09-05: 1.5 mg via TRANSDERMAL
  Administered 2014-09-05: 1 via TRANSDERMAL

## 2014-09-05 MED ORDER — LACTATED RINGERS IV SOLN
INTRAVENOUS | Status: DC
  Administered 2014-09-05 (×2): via INTRAVENOUS

## 2014-09-05 MED ORDER — ONDANSETRON HCL 4 MG/2ML IJ SOLN
INTRAMUSCULAR | Status: DC | PRN
  Administered 2014-09-05: 4 mg via INTRAVENOUS

## 2014-09-05 MED ORDER — CHLORHEXIDINE GLUCONATE 4 % EX LIQD: 1.0000 "application " | Freq: Once | CUTANEOUS | Status: DC

## 2014-09-05 MED ORDER — SCOPOLAMINE 1 MG/3DAYS TD PT72
MEDICATED_PATCH | TRANSDERMAL | Status: AC
  Filled 2014-09-05: qty 1

## 2014-09-05 MED ORDER — HYDROMORPHONE HCL 1 MG/ML IJ SOLN
INTRAMUSCULAR | Status: AC
  Filled 2014-09-05: qty 1

## 2014-09-05 MED ORDER — MIDAZOLAM HCL 2 MG/2ML IJ SOLN
1.0000 mg | INTRAMUSCULAR | Status: DC | PRN
Start: 2014-09-05 — End: 2014-09-05
  Administered 2014-09-05: 2 mg via INTRAVENOUS

## 2014-09-05 MED ORDER — PROPOFOL 10 MG/ML IV BOLUS
INTRAVENOUS | Status: DC | PRN
  Administered 2014-09-05: 150 mg via INTRAVENOUS

## 2014-09-05 MED ORDER — HYDROMORPHONE HCL 1 MG/ML IJ SOLN
0.2500 mg | INTRAMUSCULAR | Status: DC | PRN
  Administered 2014-09-05: 0.5 mg via INTRAVENOUS

## 2014-09-05 MED ORDER — CEFAZOLIN SODIUM-DEXTROSE 2-3 GM-% IV SOLR
2.0000 g | INTRAVENOUS | Status: AC
  Administered 2014-09-05: 2 g via INTRAVENOUS

## 2014-09-05 MED ORDER — FENTANYL CITRATE (PF) 100 MCG/2ML IJ SOLN
50.0000 ug | INTRAMUSCULAR | Status: DC | PRN
  Administered 2014-09-05: 25 ug via INTRAVENOUS
  Administered 2014-09-05: 100 ug via INTRAVENOUS

## 2014-09-05 MED ORDER — BUPIVACAINE-EPINEPHRINE (PF) 0.25% -1:200000 IJ SOLN
INTRAMUSCULAR | Status: DC | PRN
  Administered 2014-09-05: 17 mL

## 2014-09-05 MED ORDER — MEPERIDINE HCL 25 MG/ML IJ SOLN: 6.2500 mg | INTRAMUSCULAR | Status: DC | PRN

## 2014-09-05 MED ORDER — GLYCOPYRROLATE 0.2 MG/ML IJ SOLN: 0.2000 mg | Freq: Once | INTRAMUSCULAR | Status: DC | PRN

## 2014-09-05 MED ORDER — LIDOCAINE HCL (CARDIAC) 20 MG/ML IV SOLN
INTRAVENOUS | Status: DC | PRN
  Administered 2014-09-05: 100 mg via INTRAVENOUS

## 2014-09-05 MED ORDER — DEXAMETHASONE SODIUM PHOSPHATE 4 MG/ML IJ SOLN
INTRAMUSCULAR | Status: DC | PRN
  Administered 2014-09-05: 10 mg via INTRAVENOUS

## 2014-09-05 MED ORDER — CEFAZOLIN SODIUM-DEXTROSE 2-3 GM-% IV SOLR
INTRAVENOUS | Status: AC
  Filled 2014-09-05: qty 50

## 2014-09-05 SURGICAL SUPPLY — 48 items
APPLIER CLIP 9.375 MED OPEN (MISCELLANEOUS)
APR CLP MED 9.3 20 MLT OPN (MISCELLANEOUS)
BINDER BREAST LRG (GAUZE/BANDAGES/DRESSINGS) ×2 IMPLANT
BINDER BREAST MEDIUM (GAUZE/BANDAGES/DRESSINGS) IMPLANT
BINDER BREAST XLRG (GAUZE/BANDAGES/DRESSINGS) IMPLANT
BINDER BREAST XXLRG (GAUZE/BANDAGES/DRESSINGS) IMPLANT
BLADE SURG 15 STRL LF DISP TIS (BLADE) ×1 IMPLANT
BLADE SURG 15 STRL SS (BLADE) ×3
CANISTER SUC SOCK COL 7IN (MISCELLANEOUS) IMPLANT
CANISTER SUCT 1200ML W/VALVE (MISCELLANEOUS) IMPLANT
CHLORAPREP W/TINT 26ML (MISCELLANEOUS) ×3 IMPLANT
CLIP APPLIE 9.375 MED OPEN (MISCELLANEOUS) IMPLANT
COVER BACK TABLE 60X90IN (DRAPES) ×3 IMPLANT
COVER MAYO STAND STRL (DRAPES) ×3 IMPLANT
COVER PROBE W GEL 5X96 (DRAPES) ×3 IMPLANT
DECANTER SPIKE VIAL GLASS SM (MISCELLANEOUS) IMPLANT
DEVICE DUBIN W/COMP PLATE 8390 (MISCELLANEOUS) ×3 IMPLANT
DRAPE LAPAROSCOPIC ABDOMINAL (DRAPES) ×3 IMPLANT
DRAPE UTILITY XL STRL (DRAPES) ×3 IMPLANT
ELECT COATED BLADE 2.86 ST (ELECTRODE) ×3 IMPLANT
ELECT REM PT RETURN 9FT ADLT (ELECTROSURGICAL) ×3
ELECTRODE REM PT RTRN 9FT ADLT (ELECTROSURGICAL) ×1 IMPLANT
GLOVE BIOGEL PI IND STRL 8 (GLOVE) ×1 IMPLANT
GLOVE BIOGEL PI INDICATOR 8 (GLOVE) ×2
GLOVE ECLIPSE 7.5 STRL STRAW (GLOVE) ×3 IMPLANT
GLOVE SURG SS PI 6.5 STRL IVOR (GLOVE) ×2 IMPLANT
GOWN STRL REUS W/ TWL LRG LVL3 (GOWN DISPOSABLE) ×1 IMPLANT
GOWN STRL REUS W/ TWL XL LVL3 (GOWN DISPOSABLE) ×1 IMPLANT
GOWN STRL REUS W/TWL LRG LVL3 (GOWN DISPOSABLE) ×3
GOWN STRL REUS W/TWL XL LVL3 (GOWN DISPOSABLE) ×3
KIT MARKER MARGIN INK (KITS) IMPLANT
LIQUID BAND (GAUZE/BANDAGES/DRESSINGS) ×3 IMPLANT
NDL HYPO 25X1 1.5 SAFETY (NEEDLE) ×1 IMPLANT
NEEDLE HYPO 25X1 1.5 SAFETY (NEEDLE) ×3 IMPLANT
NS IRRIG 1000ML POUR BTL (IV SOLUTION) ×2 IMPLANT
PACK BASIN DAY SURGERY FS (CUSTOM PROCEDURE TRAY) ×3 IMPLANT
PENCIL BUTTON HOLSTER BLD 10FT (ELECTRODE) ×3 IMPLANT
SLEEVE SCD COMPRESS KNEE MED (MISCELLANEOUS) ×3 IMPLANT
SPONGE LAP 4X18 X RAY DECT (DISPOSABLE) ×3 IMPLANT
SUT MON AB 5-0 PS2 18 (SUTURE) ×3 IMPLANT
SUT SILK 2 0 SH (SUTURE) IMPLANT
SUT VICRYL 3-0 CR8 SH (SUTURE) ×3 IMPLANT
SYR CONTROL 10ML LL (SYRINGE) ×3 IMPLANT
TOWEL OR 17X24 6PK STRL BLUE (TOWEL DISPOSABLE) ×5 IMPLANT
TOWEL OR NON WOVEN STRL DISP B (DISPOSABLE) ×3 IMPLANT
TUBE CONNECTING 20'X1/4 (TUBING)
TUBE CONNECTING 20X1/4 (TUBING) IMPLANT
YANKAUER SUCT BULB TIP NO VENT (SUCTIONS) IMPLANT

## 2014-09-05 NOTE — Anesthesia Preprocedure Evaluation (Signed)
Anesthesia Evaluation  Patient identified by MRN, date of birth, ID band Patient awake    Reviewed: Allergy & Precautions, NPO status , Patient's Chart, lab work & pertinent test results  Airway Mallampati: I  TM Distance: >3 FB Neck ROM: Full    Dental   Pulmonary    Pulmonary exam normal       Cardiovascular Normal cardiovascular exam    Neuro/Psych    GI/Hepatic GERD-  Medicated and Controlled,  Endo/Other  Hypothyroidism   Renal/GU      Musculoskeletal   Abdominal   Peds  Hematology   Anesthesia Other Findings   Reproductive/Obstetrics                             Anesthesia Physical Anesthesia Plan  ASA: II  Anesthesia Plan: General   Post-op Pain Management:    Induction: Intravenous  Airway Management Planned: LMA  Additional Equipment:   Intra-op Plan:   Post-operative Plan: Extubation in OR  Informed Consent: I have reviewed the patients History and Physical, chart, labs and discussed the procedure including the risks, benefits and alternatives for the proposed anesthesia with the patient or authorized representative who has indicated his/her understanding and acceptance.     Plan Discussed with: CRNA and Surgeon  Anesthesia Plan Comments:         Anesthesia Quick Evaluation

## 2014-09-05 NOTE — Anesthesia Postprocedure Evaluation (Signed)
Anesthesia Post Note  Patient: Nancy [name]  Procedure(s) Performed: Procedure(s) (LRB): LEFT BREAST LUMPECTOMY WITH RADIOACTIVE SEED LOCALIZATION (Left)  Anesthesia type: general  Patient location: PACU  Post pain: Pain level controlled  Post assessment: Patient's Cardiovascular Status Stable  Last Vitals:  Filed Vitals:   09/05/14 1215  BP: 117/55  Pulse: 63  Temp:   Resp: 17    Post vital signs: Reviewed and stable  Level of consciousness: sedated  Complications: No apparent anesthesia complications

## 2014-09-05 NOTE — Transfer of Care (Signed)
Immediate Anesthesia Transfer of Care Note  Patient: Nancy [name]  Procedure(s) Performed: Procedure(s): LEFT BREAST LUMPECTOMY WITH RADIOACTIVE SEED LOCALIZATION (Left)  Patient Location: PACU  Anesthesia Type:General  Level of Consciousness: sedated  Airway & Oxygen Therapy: Patient Spontanous Breathing and Patient connected to face mask oxygen  Post-op Assessment: Report given to RN and Post -op Vital signs reviewed and stable  Post vital signs: Reviewed and stable  Last Vitals:  Filed Vitals:   09/05/14 0939  BP: 141/72  Pulse: 74  Temp: 36.7 C  Resp: 18    Complications: No apparent anesthesia complications

## 2014-09-05 NOTE — Discharge Instructions (Signed)
[facility] °Office Phone Number [phone] ° °BREAST BIOPSY/ PARTIAL MASTECTOMY: POST OP INSTRUCTIONS ° °Always review your discharge instruction sheet given to you by the facility where your surgery was performed. ° °IF YOU HAVE DISABILITY OR FAMILY LEAVE FORMS, YOU MUST BRING THEM TO THE OFFICE FOR PROCESSING.  DO NOT GIVE THEM TO YOUR DOCTOR. ° °1. A prescription for pain medication may be given to you upon discharge.  Take your pain medication as prescribed, if needed.  If narcotic pain medicine is not needed, then you may take acetaminophen (Tylenol) or ibuprofen (Advil) as needed. °2. Take your usually prescribed medications unless otherwise directed °3. If you need a refill on your pain medication, please contact your pharmacy.  They will contact our office to request authorization.  Prescriptions will not be filled after 5pm or on week-ends. °4. You should eat very light the first 24 hours after surgery, such as soup, crackers, pudding, etc.  Resume your normal diet the day after surgery. °5. Most patients will experience some swelling and bruising in the breast.  Ice packs and a good support bra will help.  Swelling and bruising can take several days to resolve.  °6. It is common to experience some constipation if taking pain medication after surgery.  Increasing fluid intake and taking a stool softener will usually help or prevent this problem from occurring.  A mild laxative (Milk of Magnesia or Miralax) should be taken according to package directions if there are no bowel movements after 48 hours. °7. Unless discharge instructions indicate otherwise, you may remove your bandages 24-48 hours after surgery, and you may shower at that time.  You may have steri-strips (small skin tapes) in place directly over the incision.  These strips should be left on the skin for 7-10 days.  If your surgeon used skin glue on the incision, you may shower in 24 hours.  The glue will flake off over the  next 2-3 weeks.  Any sutures or staples will be removed at the office during your follow-up visit. °8. ACTIVITIES:  You may resume regular daily activities (gradually increasing) beginning the next day.  Wearing a good support bra or sports bra minimizes pain and swelling.  You may have sexual intercourse when it is comfortable. °a. You may drive when you no longer are taking prescription pain medication, you can comfortably wear a seatbelt, and you can safely maneuver your car and apply brakes. °b. RETURN TO WORK:  ______________________________________________________________________________________ °9. You should see your doctor in the office for a follow-up appointment approximately two weeks after your surgery.  Your doctor’s nurse will typically make your follow-up appointment when she calls you with your pathology report.  Expect your pathology report 2-3 business days after your surgery.  You may call to check if you do not hear from us after three days. °10. OTHER INSTRUCTIONS: _______________________________________________________________________________________________ _____________________________________________________________________________________________________________________________________ °_____________________________________________________________________________________________________________________________________ °_____________________________________________________________________________________________________________________________________ ° °WHEN TO CALL YOUR DOCTOR: °1. Fever over 101.0 °2. Nausea and/or vomiting. °3. Extreme swelling or bruising. °4. Continued bleeding from incision. °5. Increased pain, redness, or drainage from the incision. ° °The clinic staff is available to answer your questions during regular business hours.  Please don’t hesitate to call and ask to speak to one of the nurses for clinical concerns.  If you have a medical emergency, go to the nearest  emergency room or call 911.  A surgeon from [facility] is always on call at the hospital. ° °For further questions, please visit [email or web address]  ° ° ° °  Post Anesthesia Home Care Instructions ° °Activity: °Get plenty of rest for the remainder of the day. A responsible adult should stay with you for 24 hours following the procedure.  °For the next 24 hours, DO NOT: °-Drive a car °-Operate machinery °-Drink alcoholic beverages °-Take any medication unless instructed by your physician °-Make any legal decisions or sign important papers. ° °Meals: °Start with liquid foods such as gelatin or soup. Progress to regular foods as tolerated. Avoid greasy, spicy, heavy foods. If nausea and/or vomiting occur, drink only clear liquids until the nausea and/or vomiting subsides. Call your physician if vomiting continues. ° °Special Instructions/Symptoms: °Your throat may feel dry or sore from the anesthesia or the breathing tube placed in your throat during surgery. If this causes discomfort, gargle with warm salt water. The discomfort should disappear within 24 hours. ° °If you had a scopolamine patch placed behind your ear for the management of post- operative nausea and/or vomiting: ° °1. The medication in the patch is effective for 72 hours, after which it should be removed.  Wrap patch in a tissue and discard in the trash. Wash hands thoroughly with soap and water. °2. You may remove the patch earlier than 72 hours if you experience unpleasant side effects which may include dry mouth, dizziness or visual disturbances. °3. Avoid touching the patch. Wash your hands with soap and water after contact with the patch. °  ° °

## 2014-09-05 NOTE — H&P (Signed)
History of Present Illness Nancy [name]. Ivionna [name] [name]; 08/18/2014 10:34 AM) Patient words: discuss abnormal Breast MRI.  The patient is a 69 year old female who presents with a complaint of Breast problems. She returns for followup for fibrocystic breast disease with a persistent area of thickening in the lateral aspect of the left breast. This has been stable on exam for over 5 years with previous MRI and ultrasound showing benign changes. Due to persistent symptoms and dense breasts on mammogram we elected to repeat an MRI for evaluation. This actually showed no abnormalities in the upper outer left breast, however showed linear non-mass enhancement involving the lower outer left breast at the 5:30 position measuring about 2.8 cm. Biopsy was recommended and performed. This has showed a complex sclerosing lesion/radial scar with usual ductal hyperplasia. She has not noted any abnormalities in this area of her breast.   Problem List/Past Medical Edward Jolly, [name]; 08/18/2014 10:37 AM) RADIAL SCAR OF BREAST (611.89  N64.89) FIBROCYSTIC BREAST CHANGES, LEFT (610.1  N60.12)  Other Problems Edward Jolly, [name]; 08/18/2014 10:37 AM) Thyroid Disease Unspecified Diagnosis Hypercholesterolemia Lump In Breast Oophorectomy Bilateral. General anesthesia - complications Back Pain Diverticulosis Gastroesophageal Reflux Disease  Past Surgical History Edward Jolly, [name]; 08/18/2014 10:37 AM) Foot Surgery Bilateral. Hysterectomy (not due to cancer) - Partial Thyroid Surgery Breast Biopsy Bilateral. Tonsillectomy  Diagnostic Studies History Edward Jolly, [name]; 08/18/2014 10:37 AM) Colonoscopy within last year Mammogram within last year Pap Smear 1-5 years ago  Allergies Edward Jolly, [name]; 08/18/2014 10:37 AM) Tape 1"X5yd *MEDICAL DEVICES AND SUPPLIES* Cipro HC *OTIC AGENTS* Strawberry Flavor *PHARMACEUTICAL ADJUVANTS* MetroNIDAZOLE  *Anti-infective Agents - Misc.** Sulfacetamide *CHEMICALS*  Medication History Edward Jolly, [name]; 08/18/2014 10:37 AM) ALPRAZolam (0.25MG  Tablet, 1 (one) Tablet Oral one time dose, Taken starting 07/26/2014) Active. Meloxicam (15MG  Tablet, Oral daily) Active. Omeprazole (40MG  Capsule DR, Oral) Active. Simvastatin (10MG  Tablet, Oral daily) Active. Triamcinolone Acetonide (0.1% Cream, External as needed) Active. Synthroid (88MCG Tablet, Oral) Active. Calcium/Vitamin D/Minerals (600-200MG -UNIT Tablet, 1 (one) Oral daily) Active. Multiple Vitamin (1 (one) Oral daily) Active. Flonase Allergy Relief (50MCG/ACT Suspension, Nasal as needed) Active.  Pregnancy / Birth History Edward Jolly, [name]; 08/18/2014 10:37 AM) [name] Age 4 Maternal age 33-20 Para 4 Contraceptive History Oral contraceptives. Age at menarche 28 years. Age of menopause [age]  Vitals Ivor Costa CMA; 08/18/2014 10:07 AM) 08/18/2014 10:06 AM Weight: 163.6 lb Temp.: 97.47F(Temporal)  Pulse: 76 (Regular)  Resp.: 16 (Unlabored)  BP: 124/76 (Sitting, Left Arm, Standard)    Physical Exam Nancy [name]; 08/18/2014 10:35 AM) The physical exam findings are as follows: Note:General: Alert, well-developed and well nourished Caucasian female, in no distress Skin: Warm and dry without rash or infection. HEENT: No palpable masses or thyromegaly. Sclera nonicteric. Pupils equal round and reactive. Oropharynx clear. Lymph nodes: No cervical, supraclavicular, or inguinal nodes palpable. Breasts: Some bruising lower left breast. No palpable masses. No skin changes or nipple discharge. No palpable axillary adenopathy. Lungs: Breath sounds clear and equal. No wheezing or increased work of breathing. Cardiovascular: Regular rate and rhythm without murmer. No JVD or edema. Peripheral pulses intact. No carotid bruits. Neurologic: Alert and fully oriented. Gait normal. No focal  weakness. Psychiatric: Normal mood and affect. Thought content appropriate with normal judgement and insight    Assessment & Plan Nancy [name]. Frantz Quattrone [name]; 08/18/2014 10:38 AM) RADIAL SCAR OF BREAST (611.89  N64.89) Impression: Abnormal MRI with biopsy showing radial scar lower outer left breast. We discussed  the diagnosis and the small chance that this could be associated with an early malignancy. We discussed the standard recommendation is excisional biopsy and this is what she would like to do. We discussed options of observation. The surgery including its nature, recovery, risks of bleeding infection and anesthetic complications were discussed and understood and she desires to proceed. Current Plans  Schedule for Surgery Radioactive seed localized left breast lumpectomy under general anesthesia as an outpatient

## 2014-09-05 NOTE — Anesthesia Procedure Notes (Signed)
Procedure Name: LMA Insertion Date/Time: 09/05/2014 10:31 AM Performed by: Maryella Shivers Pre-anesthesia Checklist: Patient identified, Emergency Drugs available, Suction available and Patient being monitored Patient Re-evaluated:Patient Re-evaluated prior to inductionOxygen Delivery Method: Circle System Utilized Preoxygenation: Pre-oxygenation with 100% oxygen Intubation Type: IV induction Ventilation: Mask ventilation without difficulty LMA: LMA inserted LMA Size: 4.0 Number of attempts: 1 Airway Equipment and Method: Bite block Placement Confirmation: positive ETCO2 Tube secured with: Tape Dental Injury: Teeth and Oropharynx as per pre-operative assessment

## 2014-09-05 NOTE — Interval H&P Note (Signed)
History and Physical Interval Note:  09/05/2014 10:22 AM  Nancy [name]  has presented today for surgery, with the diagnosis of radial scar left breast  The various methods of treatment have been discussed with the patient and family. After consideration of risks, benefits and other options for treatment, the patient has consented to  Procedure(s): LEFT BREAST LUMPECTOMY WITH RADIOACTIVE SEED LOCALIZATION (Left) as a surgical intervention .  The patient's history has been reviewed, patient examined, no change in status, stable for surgery.  I have reviewed the patient's chart and labs.  Questions were answered to the patient's satisfaction.     Kolin Erdahl T

## 2014-09-05 NOTE — Op Note (Signed)
Preoperative Diagnosis: radial scar left breast  Postoprative Diagnosis: radial scar left breast  Procedure: Procedure(s): LEFT BREAST LUMPECTOMY WITH RADIOACTIVE SEED LOCALIZATION   Surgeon: Excell Seltzer T   Assistants: None  Anesthesia:  General LMA anesthesia  Indications: Patient has a history of fibrocystic disease and dense breast tissue with chronic palpable abnormalities. Recent MRI was performed for screening which revealed an abnormal area of increased density in the lower outer left breast. Large core needle biopsy was performed revealing a radial scar. After discussion of options and risks of surgery detailed elsewhere we have elected to proceed with excisional biopsy to rule out malignancy.    Procedure Detail: Patient several days ago had undergone accurate placement of a radioactive seed at the area of abnormality and marking clip. Seed placement was confirmed with the neoprobe in the holding area. She was brought to the operating room, placed in the supine position on the operating table, and laryngeal mask general anesthesia induced. The left breast was widely sterilely prepped and draped. She was given preoperative IV antibiotics. PAS were in place. Patient timeout was performed and correct procedure verified. The seed was localized in the lower outer left breast with the neoprobe and a curvilinear incision made directly over this. Dissection was carried down through the Subcutaneous tissue to the breast capsule. Short skin and subcutaneous flaps were raised. Using the neoprobe for guidance and approximately 2 cm area of breast tissue was excised around the seed. The specimen was marked with ink. Specimen mammogram showed the radioactive seed and the marking clip centrally located within the specimen. There was no significant palpable abnormality with some scarring consistent with fibrocystic disease. The soft tissue was infiltrated with Marcaine. Complete hemostasis was  obtained. The breast and subcutaneous tissue were closed with interrupted 3-0 Vicryl and the skin with running subcuticular 4-0 Monocryl and Dermabond. Sponge needle and instrument counts were correct.    Findings: As above  Estimated Blood Loss:  Minimal         Drains: none  Blood Given: none          Specimens: Left breast lumpectomy        Complications:  * No complications entered in OR log *         Disposition: PACU - hemodynamically stable.         Condition: stable

## 2014-09-06 ENCOUNTER — Encounter (HOSPITAL_BASED_OUTPATIENT_CLINIC_OR_DEPARTMENT_OTHER): Payer: Self-pay | Admitting: General Surgery

## 2014-10-06 ENCOUNTER — Other Ambulatory Visit: Payer: Self-pay

## 2014-11-30 ENCOUNTER — Ambulatory Visit (INDEPENDENT_AMBULATORY_CARE_PROVIDER_SITE_OTHER): Payer: Medicare PPO | Admitting: Primary Care

## 2014-11-30 ENCOUNTER — Encounter: Payer: Self-pay | Admitting: Primary Care

## 2014-11-30 VITALS — BP 118/72 | HR 71 | Temp 97.8°F | Ht 63.5 in | Wt 168.0 lb

## 2014-11-30 DIAGNOSIS — L03211 Cellulitis of face: Secondary | ICD-10-CM | POA: Diagnosis not present

## 2014-11-30 MED ORDER — DOXYCYCLINE HYCLATE 100 MG PO TABS: 100.0000 mg | ORAL_TABLET | Freq: Two times a day (BID) | ORAL | Status: DC

## 2014-11-30 NOTE — Progress Notes (Signed)
Subjective:    Patient ID: Nancy [name], female    DOB: 1945-05-16, 69 y.o.   MRN: [id number]  HPI  Nancy [name] is a 69 year old female who presents today with a chief complaint of wound infection. Her wound is located to the right temporal region of her face and has been present since Monday this week. She believes she was bitten by an insect. She's applied alcohol, neosporin, and calamine lotion without relief. She has pain and describes it as burning that is intermittent. She has since developed tenderness to the right jaw line since yesterday . She denies itching, fevers, no improvement in size.   Review of Systems  Constitutional: Negative for fever, chills and fatigue.  HENT:       Tenderness to right jaw  Skin: Positive for wound.  Neurological: Negative for weakness and headaches.       Past Medical History  Diagnosis Date  . Hypothyroidism   . Diverticulosis   . Hyperlipidemia   . 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 Fibrocystic breast   . Allergy   . Nephrolithiasis     Hx of  . Abnormal Pap smear     ASCUS  . GERD (gastroesophageal reflux disease)   . Rosacea   . DDD (degenerative disc disease), lumbar   . Positive H. pylori test   . Ganglion cyst of wrist   . Cataract   . Esophagus, Barrett's     Social History   Social History  . Marital Status: Married    Spouse Name: N/A  . Number of Children: N/A  . Years of Education: N/A   Occupational History  . Not on file.   Social History Main Topics  . Smoking status: Never Smoker   . Smokeless tobacco: Never Used  . Alcohol Use: 0.0 oz/week    2-3 Glasses of wine per week     Comment: glass of wine every other night, beer once a month  . Drug Use: No  . Sexual Activity: No   Other Topics Concern  . Not on file   Social History Narrative    Past Surgical History  Procedure Laterality Date  . Tonsillectomy  1968  . Thyroidectomy  1972  . Abdominal hysterectomy  2005  . Bunionectomy  2010  .  Laparoscopy      D&C  . Dilation and curettage of uterus    . Tubal ligation    . Breast cyst aspiration  06/2003    and 04/2004  . Colonoscopy  2006  . Bilateral salpingoophorectomy  2005  . Metatarsal osteotomy with bunionectomy Bilateral 2011  . Right breast lumpectomy    . Left breast lumpectomy    . Breast lumpectomy with radioactive seed localization Left 09/05/2014    Procedure: LEFT BREAST LUMPECTOMY WITH RADIOACTIVE SEED LOCALIZATION;  Surgeon: Excell Seltzer, MD;  Location: [facility];  Service: General;  Laterality: Left;    Family History  Problem Relation Age of Onset  . Lung cancer Father   . Melanoma Sister   . Stroke Mother   . Colon cancer Neg Hx   . Stomach cancer Neg Hx     Allergies  Allergen Reactions  . Adhesive [Tape]   . Bee Venom   . Ciprofloxacin     REACTION: rash  . Latex     REACTION: rash  . Strawberry Hives  . Sulfa Drugs Cross Reactors Diarrhea  . Sulfonamide Derivatives  REACTION: rash  . Metronidazole Rash    Current Outpatient Prescriptions on File Prior to Visit  Medication Sig Dispense Refill  . aspirin 81 MG tablet Take 81 mg by mouth daily.      . calcium-vitamin D (OSCAL WITH D) 500-200 MG-UNIT per tablet Take 1 tablet by mouth daily.    . fluticasone (FLONASE) 50 MCG/ACT nasal spray 1 spray by Nasal route daily as needed.      Marland Kitchen Ketotifen Fumarate (ZADITOR OP) Apply to eye as needed.     Marland Kitchen levothyroxine (SYNTHROID, LEVOTHROID) 88 MCG tablet Take 88 mcg by mouth daily.      . Multiple Vitamins-Minerals (CENTRUM PO) Take by mouth daily.    . Omega-3 Fatty Acids (FISH OIL) 500 MG CAPS Take 1 capsule by mouth daily.    Marland Kitchen omeprazole (PRILOSEC) 40 MG capsule Take 1 capsule (40 mg total) by mouth daily. 30 capsule 11  . Polyethyl Glycol-Propyl Glycol (SYSTANE) 0.4-0.3 % SOLN Apply 1 drop to eye as needed.      . Probiotic Product (PROBIOTIC DAILY PO) Take by mouth daily.    . simvastatin (ZOCOR) 10 MG tablet Take  1 tablet (10 mg total) by mouth daily. 30 tablet 11   No current facility-administered medications on file prior to visit.    BP 118/72 mmHg  Pulse 71  Temp(Src) 97.8 F (36.6 C) (Oral)  Ht 5' 3.5" (1.613 m)  Wt 168 lb (76.204 kg)  BMI 29.29 kg/m2  SpO2 97%    Objective:   Physical Exam  Constitutional: She appears well-nourished.  HENT:  Right Ear: Tympanic membrane and ear canal normal.  Left Ear: Tympanic membrane and ear canal normal.  Nose: Nose normal. Right sinus exhibits no maxillary sinus tenderness and no frontal sinus tenderness. Left sinus exhibits no maxillary sinus tenderness and no frontal sinus tenderness.  Mouth/Throat: Oropharynx is clear and moist.  Neck: Neck supple.  Cardiovascular: Normal rate and regular rhythm.   Pulmonary/Chest: Effort normal and breath sounds normal.  Lymphadenopathy:    She has no cervical adenopathy.  Skin: Skin is warm and dry.  3 cm raised area of erythema. Non fluctuant, appears to be infected insect bite. Tender upon palpation to side as well as right lateral mandible.           Assessment & Plan:  Cellulitis:  Present to right temporal region since Monday this week. No improvement, now with burning pain to wound and pain to right jaw line. Suspect insect bite. Appears infectious. Does not appear to be tick borne illness. RX for doxycycline BID x 7 days. Ibuprofen for pain, warm compresses. Follow up if no improvement.

## 2014-11-30 NOTE — Patient Instructions (Signed)
Start Doxycycline antibiotics for facial wound. Take 1 tablet by mouth twice daily for 7 days.  Please notify me if no improvement in the next 3-4 days.  It was a pleasure meeting you!

## 2014-11-30 NOTE — Progress Notes (Signed)
Pre visit review using our clinic review tool, if applicable. No additional management support is needed unless otherwise documented below in the visit note. 

## 2014-12-03 ENCOUNTER — Encounter: Payer: Self-pay | Admitting: Emergency Medicine

## 2014-12-03 ENCOUNTER — Inpatient Hospital Stay
Admission: EM | Admit: 2014-12-03 | Discharge: 2014-12-05 | DRG: 603 | Disposition: A | Discharge status: Home / Self Care | Payer: Medicare PPO | Attending: Specialist | Admitting: Specialist

## 2014-12-03 ENCOUNTER — Emergency Department: Payer: Medicare PPO

## 2014-12-03 DIAGNOSIS — Z9889 Other specified postprocedural states: Secondary | ICD-10-CM | POA: Diagnosis not present

## 2014-12-03 DIAGNOSIS — K219 Gastro-esophageal reflux disease without esophagitis: Secondary | ICD-10-CM | POA: Diagnosis present

## 2014-12-03 DIAGNOSIS — Z882 Allergy status to sulfonamides status: Secondary | ICD-10-CM

## 2014-12-03 DIAGNOSIS — Z808 Family history of malignant neoplasm of other organs or systems: Secondary | ICD-10-CM | POA: Diagnosis not present

## 2014-12-03 DIAGNOSIS — Z888 Allergy status to other drugs, medicaments and biological substances status: Secondary | ICD-10-CM

## 2014-12-03 DIAGNOSIS — E063 Autoimmune thyroiditis: Secondary | ICD-10-CM | POA: Diagnosis present

## 2014-12-03 DIAGNOSIS — Z9851 Tubal ligation status: Secondary | ICD-10-CM | POA: Diagnosis not present

## 2014-12-03 DIAGNOSIS — L719 Rosacea, unspecified: Secondary | ICD-10-CM | POA: Diagnosis present

## 2014-12-03 DIAGNOSIS — Z881 Allergy status to other antibiotic agents status: Secondary | ICD-10-CM | POA: Diagnosis not present

## 2014-12-03 DIAGNOSIS — M5136 Other intervertebral disc degeneration, lumbar region: Secondary | ICD-10-CM | POA: Diagnosis present

## 2014-12-03 DIAGNOSIS — W57XXXA Bitten or stung by nonvenomous insect and other nonvenomous arthropods, initial encounter: Secondary | ICD-10-CM | POA: Diagnosis present

## 2014-12-03 DIAGNOSIS — Z79899 Other long term (current) drug therapy: Secondary | ICD-10-CM

## 2014-12-03 DIAGNOSIS — N6019 Diffuse cystic mastopathy of unspecified breast: Secondary | ICD-10-CM | POA: Diagnosis present

## 2014-12-03 DIAGNOSIS — Z7982 Long term (current) use of aspirin: Secondary | ICD-10-CM

## 2014-12-03 DIAGNOSIS — L03211 Cellulitis of face: Principal | ICD-10-CM | POA: Diagnosis present

## 2014-12-03 DIAGNOSIS — E890 Postprocedural hypothyroidism: Secondary | ICD-10-CM | POA: Diagnosis present

## 2014-12-03 DIAGNOSIS — H269 Unspecified cataract: Secondary | ICD-10-CM | POA: Diagnosis present

## 2014-12-03 DIAGNOSIS — E039 Hypothyroidism, unspecified: Secondary | ICD-10-CM | POA: Diagnosis present

## 2014-12-03 DIAGNOSIS — Z823 Family history of stroke: Secondary | ICD-10-CM

## 2014-12-03 DIAGNOSIS — Z90710 Acquired absence of both cervix and uterus: Secondary | ICD-10-CM

## 2014-12-03 DIAGNOSIS — E785 Hyperlipidemia, unspecified: Secondary | ICD-10-CM | POA: Diagnosis present

## 2014-12-03 DIAGNOSIS — K2270 Barrett's esophagus without dysplasia: Secondary | ICD-10-CM | POA: Diagnosis present

## 2014-12-03 DIAGNOSIS — Z801 Family history of malignant neoplasm of trachea, bronchus and lung: Secondary | ICD-10-CM

## 2014-12-03 LAB — CBC
HCT: 43.3 % (ref 35.0–47.0)
Hemoglobin: 14.4 g/dL (ref 12.0–16.0)
MCH: 31.8 pg (ref 26.0–34.0)
MCHC: 33.3 g/dL (ref 32.0–36.0)
MCV: 95.4 fL (ref 80.0–100.0)
PLATELETS: 218 10*3/uL (ref 150–440)
RBC: 4.54 MIL/uL (ref 3.80–5.20)
RDW: 12.4 % (ref 11.5–14.5)
WBC: 4.8 10*3/uL (ref 3.6–11.0)

## 2014-12-03 LAB — BASIC METABOLIC PANEL
ANION GAP: 4 — AB (ref 5–15)
BUN: 15 mg/dL (ref 6–20)
CO2: 28 mmol/L (ref 22–32)
Calcium: 8.9 mg/dL (ref 8.9–10.3)
Chloride: 108 mmol/L (ref 101–111)
Creatinine, Ser: 0.74 mg/dL (ref 0.44–1.00)
GLUCOSE: 116 mg/dL — AB (ref 65–99)
POTASSIUM: 3.9 mmol/L (ref 3.5–5.1)
Sodium: 140 mmol/L (ref 135–145)

## 2014-12-03 MED ORDER — ENOXAPARIN SODIUM 40 MG/0.4ML ~~LOC~~ SOLN
40.0000 mg | SUBCUTANEOUS | Status: DC
  Administered 2014-12-03: 40 mg via SUBCUTANEOUS
  Filled 2014-12-03: qty 0.4

## 2014-12-03 MED ORDER — SODIUM CHLORIDE 0.9 % IV BOLUS (SEPSIS)
1000.0000 mL | Freq: Once | INTRAVENOUS | Status: AC
  Administered 2014-12-03: 1000 mL via INTRAVENOUS

## 2014-12-03 MED ORDER — LEVOTHYROXINE SODIUM 88 MCG PO TABS
88.0000 ug | ORAL_TABLET | Freq: Every day | ORAL | Status: DC
  Administered 2014-12-04 – 2014-12-05 (×2): 88 ug via ORAL
  Filled 2014-12-03 (×2): qty 1

## 2014-12-03 MED ORDER — SIMVASTATIN 10 MG PO TABS
10.0000 mg | ORAL_TABLET | Freq: Every day | ORAL | Status: DC
  Administered 2014-12-03 – 2014-12-04 (×2): 10 mg via ORAL
  Filled 2014-12-03 (×2): qty 1

## 2014-12-03 MED ORDER — IOHEXOL 300 MG/ML  SOLN
75.0000 mL | Freq: Once | INTRAMUSCULAR | Status: AC | PRN
  Administered 2014-12-03: 75 mL via INTRAVENOUS

## 2014-12-03 MED ORDER — RISAQUAD PO CAPS
ORAL_CAPSULE | Freq: Every day | ORAL | Status: DC
  Administered 2014-12-03 – 2014-12-05 (×3): 1 via ORAL
  Filled 2014-12-03 (×3): qty 1

## 2014-12-03 MED ORDER — ASPIRIN EC 81 MG PO TBEC
81.0000 mg | DELAYED_RELEASE_TABLET | Freq: Every day | ORAL | Status: DC
Start: 2014-12-03 — End: 2014-12-05
  Administered 2014-12-04 – 2014-12-05 (×2): 81 mg via ORAL
  Filled 2014-12-03 (×3): qty 1

## 2014-12-03 MED ORDER — CLINDAMYCIN PHOSPHATE 900 MG/50ML IV SOLN
900.0000 mg | Freq: Once | INTRAVENOUS | Status: AC
  Administered 2014-12-03: 900 mg via INTRAVENOUS
  Filled 2014-12-03: qty 50

## 2014-12-03 MED ORDER — PANTOPRAZOLE SODIUM 40 MG PO TBEC
40.0000 mg | DELAYED_RELEASE_TABLET | Freq: Every day | ORAL | Status: DC
  Administered 2014-12-04 – 2014-12-05 (×2): 40 mg via ORAL
  Filled 2014-12-03 (×2): qty 1

## 2014-12-03 MED ORDER — CALCIUM CARBONATE-VITAMIN D 500-200 MG-UNIT PO TABS
1.0000 | ORAL_TABLET | Freq: Every day | ORAL | Status: DC
  Administered 2014-12-03 – 2014-12-05 (×3): 1 via ORAL
  Filled 2014-12-03 (×3): qty 1

## 2014-12-03 MED ORDER — ONDANSETRON HCL 4 MG/2ML IJ SOLN: 4.0000 mg | Freq: Four times a day (QID) | INTRAMUSCULAR | Status: DC | PRN

## 2014-12-03 MED ORDER — ONDANSETRON HCL 4 MG PO TABS
4.0000 mg | ORAL_TABLET | Freq: Four times a day (QID) | ORAL | Status: DC | PRN
Start: 2014-12-03 — End: 2014-12-05

## 2014-12-03 MED ORDER — CLINDAMYCIN PHOSPHATE 600 MG/50ML IV SOLN
600.0000 mg | Freq: Four times a day (QID) | INTRAVENOUS | Status: DC
  Administered 2014-12-04 – 2014-12-05 (×7): 600 mg via INTRAVENOUS
  Filled 2014-12-03 (×10): qty 50

## 2014-12-03 MED ORDER — FLUTICASONE PROPIONATE 50 MCG/ACT NA SUSP
1.0000 | Freq: Every day | NASAL | Status: DC | PRN
  Filled 2014-12-03: qty 16

## 2014-12-03 MED ORDER — INFLUENZA VAC SPLIT QUAD 0.5 ML IM SUSY: 0.5000 mL | PREFILLED_SYRINGE | INTRAMUSCULAR | Status: DC

## 2014-12-03 MED ORDER — DOCUSATE SODIUM 100 MG PO CAPS
100.0000 mg | ORAL_CAPSULE | Freq: Two times a day (BID) | ORAL | Status: DC
  Administered 2014-12-03 – 2014-12-05 (×4): 100 mg via ORAL
  Filled 2014-12-03 (×4): qty 1

## 2014-12-03 MED ORDER — OXYCODONE HCL 5 MG PO TABS: 5.0000 mg | ORAL_TABLET | ORAL | Status: DC | PRN

## 2014-12-03 MED ORDER — ACETAMINOPHEN 650 MG RE SUPP: 650.0000 mg | Freq: Four times a day (QID) | RECTAL | Status: DC | PRN

## 2014-12-03 MED ORDER — KETOTIFEN FUMARATE 0.025 % OP SOLN
1.0000 [drp] | Freq: Two times a day (BID) | OPHTHALMIC | Status: DC
  Filled 2014-12-03: qty 5

## 2014-12-03 MED ORDER — ACETAMINOPHEN 325 MG PO TABS
650.0000 mg | ORAL_TABLET | Freq: Four times a day (QID) | ORAL | Status: DC | PRN
  Administered 2014-12-04 – 2014-12-05 (×2): 650 mg via ORAL
  Filled 2014-12-03 (×2): qty 2

## 2014-12-03 NOTE — ED Notes (Signed)
Open area noted to R temple. Periwound skin red, swollen - pt states this is unchanged since starting doxycycline. Periorbital area inflamed/swollen to R side. C/o pain 4/10. Hives noted to face, neck, chest, and back.

## 2014-12-03 NOTE — ED Notes (Addendum)
Pt reports spider bite to right side of face on Tuesday, saw PCP on Thursday and rx doxycycline. Pt reports taking 5 doses of doxy, pt with hives to face chest and abdomen today. Pt also with swelling around right eye. Pt reports taking 50mg  of benadryl this am, sent here by nurse line. Pt reports symptoms not improving, pt denies any shortness of breath, itching throat or oral swelling.

## 2014-12-03 NOTE — H&P (Signed)
[facility] [place] NAME: Nancy [name]    MR#:  [id number]  DATE OF BIRTH:  [birth year]  DATE OF ADMISSION:  12/03/2014  PRIMARY CARE PHYSICIAN: Loura Pardon, MD   REQUESTING/REFERRING PHYSICIAN: Mariea Clonts  CHIEF COMPLAINT:   Insect bite, redness of the face HISTORY OF PRESENT ILLNESS:  Nancy [name]  is a 69 y.o. female with a known history of hypothyroidism, hyperlipidemia, fibrocystic breast disease and other medical problems is presenting to the ED with a chief complaint of redness and swelling of the right side of her face after an insect bite probably spider per her primary care physician. Patient reported that she was bitten by something approximately 6 days ago. She developed a sore in the right temporal area following the bite and noticed clear liquid oozing out of it. The area became red, and patient was seen by her primary care physician 3 days ago and was started her on doxycycline with no significant improvement. Patient noticed spreading of the redness from the right temporal to the right lower eyelid and rash on her face. In the ED patient had CAT scan of the face which has revealed no abscess or post-septal cellulitis. Doxycycline was discontinued by the ED physician and patient was started on IV clindamycin. Patient denies any blurry vision R eye pain. No similar complaints in the past. Denies any fever  PAST MEDICAL HISTORY:   Past Medical History  Diagnosis Date  . Hypothyroidism   . Diverticulosis   . Hyperlipidemia   . 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 Fibrocystic breast   . Allergy   . Nephrolithiasis     Hx of  . Abnormal Pap smear     ASCUS  . GERD (gastroesophageal reflux disease)   . Rosacea   . DDD (degenerative disc disease), lumbar   . Positive H. pylori test   . Ganglion cyst of wrist   . Cataract   . Esophagus, Barrett's     PAST SURGICAL HISTOIRY:   Past Surgical History  Procedure Laterality  Date  . Tonsillectomy  1968  . Thyroidectomy  1972  . Abdominal hysterectomy  2005  . Bunionectomy  2010  . Laparoscopy      D&C  . Dilation and curettage of uterus    . Tubal ligation    . Breast cyst aspiration  06/2003    and 04/2004  . Colonoscopy  2006  . Bilateral salpingoophorectomy  2005  . Metatarsal osteotomy with bunionectomy Bilateral 2011  . Right breast lumpectomy    . Left breast lumpectomy    . Breast lumpectomy with radioactive seed localization Left 09/05/2014    Procedure: LEFT BREAST LUMPECTOMY WITH RADIOACTIVE SEED LOCALIZATION;  Surgeon: Excell Seltzer, MD;  Location: [facility];  Service: General;  Laterality: Left;    SOCIAL HISTORY:   Social History  Substance Use Topics  . Smoking status: Never Smoker   . Smokeless tobacco: Never Used  . Alcohol Use: 0.0 oz/week    2-3 Glasses of wine per week     Comment: glass of wine every other night, beer once a month    FAMILY HISTORY:   Family History  Problem Relation Age of Onset  . Lung cancer Father   . Melanoma Sister   . Stroke Mother   . Colon cancer Neg Hx   . Stomach cancer Neg Hx     DRUG ALLERGIES:   Allergies  Allergen  Reactions  . Bee Venom Swelling  . Ciprofloxacin     REACTION: rash  . Latex     REACTION: rash  . Strawberry Hives  . Sulfa Drugs Cross Reactors Diarrhea  . Sulfonamide Derivatives     REACTION: rash  . Adhesive [Tape] Rash  . Metronidazole Rash    REVIEW OF SYSTEMS:  CONSTITUTIONAL: No fever, fatigue or weakness.  EYES: No blurred or double vision. Denies any eye pain or discharge from the eye EARS, NOSE, AND THROAT: No tinnitus or ear pain.  RESPIRATORY: No cough, shortness of breath, wheezing or hemoptysis.  CARDIOVASCULAR: No chest pain, orthopnea, edema.  GASTROINTESTINAL: No nausea, vomiting, diarrhea or abdominal pain.  GENITOURINARY: No dysuria, hematuria.  ENDOCRINE: No polyuria, nocturia,  HEMATOLOGY: No anemia, easy bruising or  bleeding SKIN: No rash or lesion. Right temple area of the face and right infraorbital area are red, painful and puffy, Rash is noticed on the face spreading onto the posterior area of neck and upper part of the chest.  MUSCULOSKELETAL: No joint pain or arthritis.   NEUROLOGIC: No tingling, numbness, weakness.  PSYCHIATRY: No anxiety or depression.   MEDICATIONS AT HOME:   Prior to Admission medications   Medication Sig Start Date End Date Taking? Authorizing Provider  aspirin 81 MG tablet Take 81 mg by mouth daily.      Historical Provider, MD  calcium-vitamin D (OSCAL WITH D) 500-200 MG-UNIT per tablet Take 1 tablet by mouth daily.    Historical Provider, MD  doxycycline (VIBRA-TABS) 100 MG tablet Take 1 tablet (100 mg total) by mouth 2 (two) times daily. 11/30/14   Pleas Koch, NP  fluticasone (FLONASE) 50 MCG/ACT nasal spray 1 spray by Nasal route daily as needed.      Historical Provider, MD  Ketotifen Fumarate (ZADITOR OP) Apply to eye as needed.     Historical Provider, MD  levothyroxine (SYNTHROID, LEVOTHROID) 88 MCG tablet Take 88 mcg by mouth daily.      Historical Provider, MD  Multiple Vitamins-Minerals (CENTRUM PO) Take by mouth daily.    Historical Provider, MD  Omega-3 Fatty Acids (FISH OIL) 500 MG CAPS Take 1 capsule by mouth daily.    Historical Provider, MD  omeprazole (PRILOSEC) 40 MG capsule Take 1 capsule (40 mg total) by mouth daily. 03/06/14   Abner Greenspan, MD  Polyethyl Glycol-Propyl Glycol (SYSTANE) 0.4-0.3 % SOLN Apply 1 drop to eye as needed.      Historical Provider, MD  Probiotic Product (PROBIOTIC DAILY PO) Take by mouth daily.    Historical Provider, MD  simvastatin (ZOCOR) 10 MG tablet Take 1 tablet (10 mg total) by mouth daily. 03/06/14   Abner Greenspan, MD      VITAL SIGNS:  Blood pressure 122/75, pulse 65, temperature 98 F (36.7 C), temperature source Oral, resp. rate 16, height 5\' 4"  (1.626 m), weight 74.844 kg (165 lb), SpO2 99 %.  PHYSICAL  EXAMINATION:  GENERAL:  69 y.o.-year-old patient lying in the bed with no acute distress.  EYES: Pupils equal, round, reactive to light and accommodation. No scleral icterus. Extraocular muscles intact.  HEENT: Head atraumatic, normocephalic. Oropharynx and nasopharynx clear.  NECK:  Supple, no jugular venous distention. No thyroid enlargement, no tenderness.  LUNGS: Normal breath sounds bilaterally, no wheezing, rales,rhonchi or crepitation. No use of accessory muscles of respiration.  CARDIOVASCULAR: S1, S2 normal. No murmurs, rubs, or gallops.  ABDOMEN: Soft, nontender, nondistended. Bowel sounds present. No organomegaly or mass.  EXTREMITIES: No  pedal edema, cyanosis, or clubbing.  NEUROLOGIC: Cranial nerves II through XII are intact. Muscle strength 5/5 in all extremities. Sensation intact. Gait not checked.  PSYCHIATRIC: The patient is alert and oriented x 3.  SKIN:Right temple area of the face is erythematous with 0.5 cm x 2 cm size scab . Right infraorbital area is also erythematous, tender and edematous. Macular rash is noticed on the face spreading onto the posterior area of neck and upper part of the chest. Positive submandibular and posterior auricular adenopathy   LABORATORY PANEL:   CBC  Recent Labs Lab 12/03/14 1636  WBC 4.8  HGB 14.4  HCT 43.3  PLT 218   ------------------------------------------------------------------------------------------------------------------  Chemistries   Recent Labs Lab 12/03/14 1636  NA 140  K 3.9  CL 108  CO2 28  GLUCOSE 116*  BUN 15  CREATININE 0.74  CALCIUM 8.9   ------------------------------------------------------------------------------------------------------------------  Cardiac Enzymes No results for input(s): TROPONINI in the last 168 hours. ------------------------------------------------------------------------------------------------------------------  RADIOLOGY:  Ct Maxillofacial W/cm  12/03/2014   CLINICAL  DATA:  O 69 year old female with history of spider bite to the right side of the face 6 days ago, reportedly taking doxycycline presenting with hives on the face, chest and abdomen today and swelling around the right eye.  EXAM: CT MAXILLOFACIAL WITH CONTRAST  TECHNIQUE: Multidetector CT imaging of the maxillofacial structures was performed with intravenous contrast. Multiplanar CT image reconstructions were also generated. A small metallic BB was placed on the right temple in order to reliably differentiate right from left.  CONTRAST:  50mL OMNIPAQUE IOHEXOL 300 MG/ML  SOLN  COMPARISON:  No priors.  FINDINGS: Generalized soft tissue swelling in the subcutaneous fat overlying the right zygomatic arch, lateral to the right orbit and right temporal region, with associated skin thickening and diffuse infiltration of the subcutaneous fat tissues. No definable or fluid collection is identified in these regions to suggest the presence of an abscess at this time. No osseous lesions. No displaced facial bone fractures. Bilateral globes and retro-orbital soft tissues are grossly normal in appearance. Limited visualization of the intracranial contents is unremarkable. No lymphadenopathy noted. Several prominent reactive sized lymph nodes are noted, but are nonspecific.  IMPRESSION: 1. Skin thickening and infiltration of the subcutaneous fat lateral to the right orbit temporal region and right zygomatic arch, suggestive of cellulitis given the patient's history. No well-defined abscess identified at this time.   Electronically Signed   By: Vinnie Langton M.D.   On: 12/03/2014 17:55    EKG:   Orders placed or performed in visit on 02/16/12  . EKG    IMPRESSION AND PLAN:   Nancy [name]  is a 69 y.o. female with a known history of hypothyroidism, hyperlipidemia, fibrocystic breast disease and other medical problems is presenting to the ED with a chief complaint of redness and swelling of the right side of her face  after an insect bite probably spider per her primary care physician. Patient reported that she was bitten by something approximately 6 days ago. She developed a sore in the right temporal area following the bite and noticed clear liquid oozing out of it. The area became red, and patient was seen by her primary care physician 3 days ago and was started her on doxycycline with no significant improvement. Patient noticed spreading of the redness from the right temporal to the right lower eyelid and rash on her face.  #1 right facial cellulitis status post insect bite probably spider Failed outpatient treatment with doxycycline Blood  cultures were obtained in the ED Will provide her IV clindamycin with probiotics Surgical consult is placed for possible debridement  #2 history of hypothyroidism secondary to Hashimoto's thyroiditis Continue home medications and thyroid  #3 hyperlipidemia Continue her home medication Zocor  #4 fibrocystic breast disease Status post surgical removal of the cyst which was benign  #5 GERD Continue PPI     All the records are reviewed and case discussed with ED provider. Management plans discussed with the patient, family and they are in agreement. Greater than 50% time was spent on coordination of the care and face-to-face counseling  CODE STATUS: Full code, husband is the healthcare power of attorney  TOTAL TIME TAKING CARE OF THIS PATIENT: 45 minutes.    Nicholes Mango M.D on 12/03/2014 at 7:32 PM  Between 7am to 6pm - Pager - [phone]  After 6pm go to [email or web address] - password [email or web address] [facility]  [facility]  Office  [phone]  CC: Primary care physician; Loura Pardon, MD

## 2014-12-03 NOTE — ED Provider Notes (Signed)
[facility] Emergency Department Provider Note  ____________________________________________  Time seen: Approximately 3:59 PM  I have reviewed the triage vital signs and the nursing notes.   HISTORY  Chief Complaint Insect Bite and Allergic Reaction    HPI Nancy [name] is a 69 y.o. female with a history of allergy to Cipro, Flagyl, and latex presenting with swelling and erythema of the right face. Patient reports that she "was bitten by something" 6 days ago. She developed a sore over the right temple which then oozed "clear liquid."  The erythema progressed, so she went to see her PMD 3 days ago who initiated her on doxycycline. She has taken doxycycline but her erythema has worsened and now she has swelling under the right orbit without pain with eye movement. No headache, no fever, no nausea or vomiting or stiff neck. She called her PMD who recommended that she stop doxycycline and take Benadryl. Now she has noticed rash on the posterior neck and upper chest as well. She denies any shortness of breath or difficulty swallowing, swelling in the mouth and tongue.   Past Medical History  Diagnosis Date  . Hypothyroidism   . Diverticulosis   . Hyperlipidemia   . 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 Fibrocystic breast   . Allergy   . Nephrolithiasis     Hx of  . Abnormal Pap smear     ASCUS  . GERD (gastroesophageal reflux disease)   . Rosacea   . DDD (degenerative disc disease), lumbar   . Positive H. pylori test   . Ganglion cyst of wrist   . Cataract   . Esophagus, Barrett's     Patient Active Problem List   Diagnosis Date Noted  . Colon cancer screening 03/06/2014  . Ganglion cyst of wrist 03/06/2014  . Encounter for Medicare annual wellness exam 01/24/2013  . Positive H. pylori test 03/26/2012  . H/O abnormal Pap smear 05/06/2011  . GERD (gastroesophageal reflux disease) 07/16/2010  . BACK PAIN 09/06/2009  . NEPHROLITHIASIS, HX OF 09/06/2009   . Hyperglycemia 08/22/2009  . POSTMENOPAUSAL STATUS 08/29/2008  . Hypothyroidism 05/18/2006  . Hyperlipidemia 05/18/2006  . DIVERTICULOSIS, COLON 05/18/2006  . RENAL CALCULUS 05/18/2006  . FIBROCYSTIC BREAST DISEASE 05/18/2006  . ROSACEA 05/18/2006  . DEGENERATIVE DISC DISEASE 05/18/2006  . ALLERGY 05/18/2006    Past Surgical History  Procedure Laterality Date  . Tonsillectomy  1968  . Thyroidectomy  1972  . Abdominal hysterectomy  2005  . Bunionectomy  2010  . Laparoscopy      D&C  . Dilation and curettage of uterus    . Tubal ligation    . Breast cyst aspiration  06/2003    and 04/2004  . Colonoscopy  2006  . Bilateral salpingoophorectomy  2005  . Metatarsal osteotomy with bunionectomy Bilateral 2011  . Right breast lumpectomy    . Left breast lumpectomy    . Breast lumpectomy with radioactive seed localization Left 09/05/2014    Procedure: LEFT BREAST LUMPECTOMY WITH RADIOACTIVE SEED LOCALIZATION;  Surgeon: Excell Seltzer, MD;  Location: [facility];  Service: General;  Laterality: Left;    Current Outpatient Rx  Name  Route  Sig  Dispense  Refill  . aspirin 81 MG tablet   Oral   Take 81 mg by mouth daily.           . calcium-vitamin D (OSCAL WITH D) 500-200 MG-UNIT per tablet   Oral   Take  1 tablet by mouth daily.         Marland Kitchen doxycycline (VIBRA-TABS) 100 MG tablet   Oral   Take 1 tablet (100 mg total) by mouth 2 (two) times daily.   14 tablet   0   . fluticasone (FLONASE) 50 MCG/ACT nasal spray   Nasal   1 spray by Nasal route daily as needed.           Marland Kitchen Ketotifen Fumarate (ZADITOR OP)   Ophthalmic   Apply to eye as needed.          Marland Kitchen levothyroxine (SYNTHROID, LEVOTHROID) 88 MCG tablet   Oral   Take 88 mcg by mouth daily.           . Multiple Vitamins-Minerals (CENTRUM PO)   Oral   Take by mouth daily.         . Omega-3 Fatty Acids (FISH OIL) 500 MG CAPS   Oral   Take 1 capsule by mouth daily.         Marland Kitchen omeprazole  (PRILOSEC) 40 MG capsule   Oral   Take 1 capsule (40 mg total) by mouth daily.   30 capsule   11   . Polyethyl Glycol-Propyl Glycol (SYSTANE) 0.4-0.3 % SOLN   Ophthalmic   Apply 1 drop to eye as needed.           . Probiotic Product (PROBIOTIC DAILY PO)   Oral   Take by mouth daily.         . simvastatin (ZOCOR) 10 MG tablet   Oral   Take 1 tablet (10 mg total) by mouth daily.   30 tablet   11     Allergies Adhesive; Bee venom; Ciprofloxacin; Latex; Strawberry; Sulfa drugs cross reactors; Sulfonamide derivatives; and Metronidazole  Family History  Problem Relation Age of Onset  . Lung cancer Father   . Melanoma Sister   . Stroke Mother   . Colon cancer Neg Hx   . Stomach cancer Neg Hx     Social History Social History  Substance Use Topics  . Smoking status: Never Smoker   . Smokeless tobacco: Never Used  . Alcohol Use: 0.0 oz/week    2-3 Glasses of wine per week     Comment: glass of wine every other night, beer once a month    Review of Systems Constitutional: No fever/chills Eyes: No visual changes. No pain with eye movement. ENT: No sore throat.No neck stiffness Cardiovascular: Denies chest pain, palpitations. Respiratory: Denies shortness of breath.  No cough. Gastrointestinal: No abdominal pain.  No nausea, no vomiting.  No diarrhea.  No constipation. Genitourinary: Negative for dysuria. Musculoskeletal: Negative for back pain. Skin: Positive for facial rash Neurological: Negative for headaches, focal weakness or numbness.   10-point ROS otherwise negative.  ____________________________________________   PHYSICAL EXAM:  VITAL SIGNS: ED Triage Vitals  Enc Vitals Group     BP 12/03/14 1437 131/86 mmHg     Pulse Rate 12/03/14 1437 80     Resp 12/03/14 1437 16     Temp 12/03/14 1437 98 F (36.7 C)     Temp Source 12/03/14 1437 Oral     SpO2 12/03/14 1437 98 %     Weight 12/03/14 1437 165 lb (74.844 kg)     Height 12/03/14 1437 5\' 4"   (1.626 m)     Head Cir --      Peak Flow --      Pain Score 12/03/14 1437 4  Pain Loc --      Pain Edu? --      Excl. in [place]? --     Constitutional: Alert and oriented. Well appearing and in no acute distress. Answer question appropriately. Eyes: Conjunctivae are normal.  EOMI. PERRLA. See skin exam. Head: See skin exam. Nose: No congestion/rhinnorhea. Mouth/Throat: Mucous membranes are moist.  Neck: No stridor.  Supple.  No meningismus. Cardiovascular: Normal rate, regular rhythm. No murmurs, rubs or gallops.  Respiratory: Normal respiratory effort.  No retractions. Lungs CTAB.  No wheezes, rales or ronchi. Gastrointestinal: Soft and nontender. No distention. No peritoneal signs. Musculoskeletal: No LE edema.  Neurologic:  Normal speech and language. No gross focal neurologic deficits are appreciated.  Skin:  Patient has a 0.5 cm x 2 cm scab over the right temple without any discharge. The scab does have surrounding erythema which is confluent to erythema below the right orbit associated with edema. She has punctate areas of rash that are 0.25 x 0.25 cm in approximate size, 2 or 3 on the posterior neck, 3 or 4 on the upper chest. She does have a submandibular and posterior auricular adenopathy. Psychiatric: Mood and affect are normal. Speech and behavior are normal.  Normal judgement.  ____________________________________________   LABS (all labs ordered are listed, but only abnormal results are displayed)  Labs Reviewed  BASIC METABOLIC PANEL - Abnormal; Notable for the following:    Glucose, Bld 116 (*)    Anion gap 4 (*)    All other components within normal limits  CBC   ____________________________________________  EKG  Not indicated ____________________________________________  RADIOLOGY  Ct Maxillofacial W/cm  12/03/2014   CLINICAL DATA:  O 69 year old female with history of spider bite to the right side of the face 6 days ago, reportedly taking doxycycline  presenting with hives on the face, chest and abdomen today and swelling around the right eye.  EXAM: CT MAXILLOFACIAL WITH CONTRAST  TECHNIQUE: Multidetector CT imaging of the maxillofacial structures was performed with intravenous contrast. Multiplanar CT image reconstructions were also generated. A small metallic BB was placed on the right temple in order to reliably differentiate right from left.  CONTRAST:  54mL OMNIPAQUE IOHEXOL 300 MG/ML  SOLN  COMPARISON:  No priors.  FINDINGS: Generalized soft tissue swelling in the subcutaneous fat overlying the right zygomatic arch, lateral to the right orbit and right temporal region, with associated skin thickening and diffuse infiltration of the subcutaneous fat tissues. No definable or fluid collection is identified in these regions to suggest the presence of an abscess at this time. No osseous lesions. No displaced facial bone fractures. Bilateral globes and retro-orbital soft tissues are grossly normal in appearance. Limited visualization of the intracranial contents is unremarkable. No lymphadenopathy noted. Several prominent reactive sized lymph nodes are noted, but are nonspecific.  IMPRESSION: 1. Skin thickening and infiltration of the subcutaneous fat lateral to the right orbit temporal region and right zygomatic arch, suggestive of cellulitis given the patient's history. No well-defined abscess identified at this time.   Electronically Signed   By: Vinnie Langton M.D.   On: 12/03/2014 17:55    ____________________________________________   PROCEDURES  Procedure(s) performed: None  Critical Care performed: No ____________________________________________   INITIAL IMPRESSION / ASSESSMENT AND PLAN / ED COURSE  Pertinent labs & imaging results that were available during my care of the patient were reviewed by me and considered in my medical decision making (see chart for details).  69 y.o. female presenting with facial erythema,  swelling that  has failed outpatient and a bad experience the patient likely has a cellulitis which is not responsive to doxycycline. I will also get a CT of the face to evaluate for further soft tissue infection. I will initiate her on IV clindamycin. She will need admission to the hospital. Ice will be provided to decrease swelling. She is deferring any pain medications at this time. At this time, the patient does not have any systemic illness and does not have any evidence for meningitis, bacteremia.  ----------------------------------------- 6:48 PM on 12/03/2014 -----------------------------------------  The patient's CT does show some inflammation that is consistent with subcutaneous cellulitis, but no evidence of collection or abscess. She did fail outpatient oral antibiotics I will still plan to admit her to the hospital. ____________________________________________  FINAL CLINICAL IMPRESSION(S) / ED DIAGNOSES  Final diagnoses:  Facial cellulitis      NEW MEDICATIONS STARTED DURING THIS VISIT:  New Prescriptions   No medications on file     Eula Listen, MD 12/03/14 1850

## 2014-12-04 ENCOUNTER — Telehealth: Payer: Self-pay | Admitting: Family Medicine

## 2014-12-04 DIAGNOSIS — L03211 Cellulitis of face: Principal | ICD-10-CM

## 2014-12-04 LAB — COMPREHENSIVE METABOLIC PANEL
ALT: 71 U/L — ABNORMAL HIGH (ref 14–54)
ANION GAP: 7 (ref 5–15)
AST: 38 U/L (ref 15–41)
Albumin: 3.8 g/dL (ref 3.5–5.0)
Alkaline Phosphatase: 80 U/L (ref 38–126)
BILIRUBIN TOTAL: 0.5 mg/dL (ref 0.3–1.2)
BUN: 13 mg/dL (ref 6–20)
CO2: 28 mmol/L (ref 22–32)
Calcium: 8.8 mg/dL — ABNORMAL LOW (ref 8.9–10.3)
Chloride: 107 mmol/L (ref 101–111)
Creatinine, Ser: 0.7 mg/dL (ref 0.44–1.00)
GFR calc Af Amer: >60 mL/min (ref >60)
Glucose, Bld: 115 mg/dL — ABNORMAL HIGH (ref 65–99)
POTASSIUM: 4.2 mmol/L (ref 3.5–5.1)
Sodium: 142 mmol/L (ref 135–145)
TOTAL PROTEIN: 5.9 g/dL — AB (ref 6.5–8.1)

## 2014-12-04 LAB — CBC
HEMATOCRIT: 40.4 % (ref 35.0–47.0)
HEMOGLOBIN: 13.6 g/dL (ref 12.0–16.0)
MCH: 31.9 pg (ref 26.0–34.0)
MCHC: 33.7 g/dL (ref 32.0–36.0)
MCV: 94.5 fL (ref 80.0–100.0)
Platelets: 199 10*3/uL (ref 150–440)
RBC: 4.28 MIL/uL (ref 3.80–5.20)
RDW: 12.3 % (ref 11.5–14.5)
WBC: 5 10*3/uL (ref 3.6–11.0)

## 2014-12-04 LAB — GLUCOSE, CAPILLARY: Glucose-Capillary: 126 mg/dL — ABNORMAL HIGH (ref 65–99)

## 2014-12-04 MED ORDER — BACITRACIN ZINC 500 UNIT/GM EX OINT
TOPICAL_OINTMENT | Freq: Two times a day (BID) | CUTANEOUS | Status: DC
  Administered 2014-12-04 – 2014-12-05 (×3): 1 via TOPICAL
  Filled 2014-12-04 (×6): qty 0.9

## 2014-12-04 MED ORDER — KETOTIFEN FUMARATE 0.025 % OP SOLN
1.0000 [drp] | Freq: Two times a day (BID) | OPHTHALMIC | Status: DC | PRN
  Filled 2014-12-04: qty 5

## 2014-12-04 MED ORDER — ENOXAPARIN SODIUM 60 MG/0.6ML ~~LOC~~ SOLN
40.0000 mg | SUBCUTANEOUS | Status: DC
  Administered 2014-12-05: 40 mg via SUBCUTANEOUS
  Filled 2014-12-04 (×3): qty 0.6

## 2014-12-04 NOTE — Telephone Encounter (Signed)
Will follow her notes in epic

## 2014-12-04 NOTE — Progress Notes (Signed)
[facility] NAME: Nancy [name]    MR#:  [id number]  DATE OF BIRTH:  [birth year]  SUBJECTIVE:  CHIEF COMPLAINT:   Chief Complaint  Patient presents with  . Insect Bite  . Allergic Reaction    Michela Pitcher it started with a bug bite on her face, but later she also noticed red, papules on her back and abdomen - spreading on her face and limb now. They are non itching.   REVIEW OF SYSTEMS:  CONSTITUTIONAL: No fever, fatigue or weakness.  EYES: No blurred or double vision.  EARS, NOSE, AND THROAT: No tinnitus or ear pain.  RESPIRATORY: No cough, shortness of breath, wheezing or hemoptysis.  CARDIOVASCULAR: No chest pain, orthopnea, edema.  GASTROINTESTINAL: No nausea, vomiting, diarrhea or abdominal pain.  GENITOURINARY: No dysuria, hematuria.  ENDOCRINE: No polyuria, nocturia,  HEMATOLOGY: No anemia, easy bruising or bleeding SKIN: No rash or lesion. MUSCULOSKELETAL: No joint pain or arthritis.   NEUROLOGIC: No tingling, numbness, weakness.  PSYCHIATRY: No anxiety or depression.   ROS  DRUG ALLERGIES:   Allergies  Allergen Reactions  . Bee Venom Swelling  . Ciprofloxacin     REACTION: rash  . Latex     REACTION: rash  . Strawberry Hives  . Sulfa Drugs Cross Reactors Diarrhea  . Sulfonamide Derivatives     REACTION: rash  . Adhesive [Tape] Rash  . Metronidazole Rash    VITALS:  Blood pressure 113/59, pulse 65, temperature 98.4 F (36.9 C), temperature source Oral, resp. rate 17, height 5\' 4"  (1.626 m), weight 74.844 kg (165 lb), SpO2 96 %.  PHYSICAL EXAMINATION:  GENERAL:  69 y.o.-year-old patient lying in the bed with no acute distress.  EYES: Pupils equal, round, reactive to light and accommodation. No scleral icterus. Extraocular muscles intact.  HEENT: Head atraumatic, normocephalic. Oropharynx and nasopharynx clear. On right side of face, in front of ear have a rash- with some oozing. NECK:  Supple, no  jugular venous distention. No thyroid enlargement, no tenderness.  LUNGS: Normal breath sounds bilaterally, no wheezing, rales,rhonchi or crepitation. No use of accessory muscles of respiration.  CARDIOVASCULAR: S1, S2 normal. No murmurs, rubs, or gallops.  ABDOMEN: Soft, nontender, nondistended. Bowel sounds present. No organomegaly or mass.  EXTREMITIES: No pedal edema, cyanosis, or clubbing.  NEUROLOGIC: Cranial nerves II through XII are intact. Muscle strength 5/5 in all extremities. Sensation intact. Gait not checked.  PSYCHIATRIC: The patient is alert and oriented x 3.  SKIN: Right temple area of the face is erythematous with 0.5 cm x 2 cm size scab . Right infraorbital area is also erythematous, tender and edematous. Macular rash is noticed on the face spreading onto the posterior area of neck and upper part of the chest. Positive submandibular and posterior auricular adenopathy.   Physical Exam LABORATORY PANEL:   CBC  Recent Labs Lab 12/04/14 0609  WBC 5.0  HGB 13.6  HCT 40.4  PLT 199   ------------------------------------------------------------------------------------------------------------------  Chemistries   Recent Labs Lab 12/04/14 0609  NA 142  K 4.2  CL 107  CO2 28  GLUCOSE 115*  BUN 13  CREATININE 0.70  CALCIUM 8.8*  AST 38  ALT 71*  ALKPHOS 80  BILITOT 0.5   ------------------------------------------------------------------------------------------------------------------  Cardiac Enzymes No results for input(s): TROPONINI in the last 168 hours. ------------------------------------------------------------------------------------------------------------------  RADIOLOGY:  Ct Maxillofacial W/cm  12/03/2014   CLINICAL DATA:  69 year old female with history of spider bite to the right side  of the face 6 days ago, reportedly taking doxycycline presenting with hives on the face, chest and abdomen today and swelling around the right eye.  EXAM: CT  MAXILLOFACIAL WITH CONTRAST  TECHNIQUE: Multidetector CT imaging of the maxillofacial structures was performed with intravenous contrast. Multiplanar CT image reconstructions were also generated. A small metallic BB was placed on the right temple in order to reliably differentiate right from left.  CONTRAST:  35mL OMNIPAQUE IOHEXOL 300 MG/ML  SOLN  COMPARISON:  No priors.  FINDINGS: Generalized soft tissue swelling in the subcutaneous fat overlying the right zygomatic arch, lateral to the right orbit and right temporal region, with associated skin thickening and diffuse infiltration of the subcutaneous fat tissues. No definable or fluid collection is identified in these regions to suggest the presence of an abscess at this time. No osseous lesions. No displaced facial bone fractures. Bilateral globes and retro-orbital soft tissues are grossly normal in appearance. Limited visualization of the intracranial contents is unremarkable. No lymphadenopathy noted. Several prominent reactive sized lymph nodes are noted, but are nonspecific.  IMPRESSION: 1. Skin thickening and infiltration of the subcutaneous fat lateral to the right orbit temporal region and right zygomatic arch, suggestive of cellulitis given the patient's history. No well-defined abscess identified at this time.   Electronically Signed   By: Vinnie Langton M.D.   On: 12/03/2014 17:55    ASSESSMENT AND PLAN:   #1 right facial cellulitis status post insect bite probably spider Failed outpatient treatment with doxycycline Blood cultures were obtained in the ED on IV clindamycin with probiotics Surgical consult appreciated- no need for debridement  For multiple rashes on skin,- it may be some kind of reaction to infection- may be immune mediated?   NO itching or pain on those rashes.   I am not sure- what it is- I will call ID consult for that.   Confirmed with pt and husband- they don't have bed bugs.  #2 history of hypothyroidism secondary  to Hashimoto's thyroiditis Continue home medications and thyroid  #3 hyperlipidemia Continue her home medication Zocor  #4 fibrocystic breast disease Status post surgical removal of the cyst which was benign  #5 GERD Continue PPI  All the records are reviewed and case discussed with Care Management/Social Workerr. Management plans discussed with the patient, family and they are in agreement.  CODE STATUS: full.  TOTAL TIME TAKING CARE OF THIS PATIENT:30  minutes.   Called her husband on phone.  POSSIBLE D/C IN 1-2 DAYS, DEPENDING ON CLINICAL CONDITION.  Pt was not seen by morning doctor, and I was called in from admitting shift to see the pt, so I explained the pt and her husband about being late in seeing her today.  Vaughan Basta M.D on 12/04/2014   Between 7am to 6pm - Pager - [phone]  After 6pm go to [email or web address] - password [email or web address] [facility]  Office  [phone]  CC: Primary care physician; Loura Pardon, MD  Note: This dictation was prepared with Dragon dictation along with smaller phrase technology. Any transcriptional errors that result from this process are unintentional.

## 2014-12-04 NOTE — Care Management Important Message (Signed)
Important Message  Patient Details  Name: Nancy [name] MRN: [id number] Date of Birth: 22-Jul-1945   Medicare Important Message Given:  Yes-second notification given    Alvie Heidelberg, RN 12/04/2014, 1:07 PM

## 2014-12-04 NOTE — Consult Note (Signed)
Patient ID: Nancy [name], female   DOB: 09-17-1945, 69 y.o.   MRN: [id number]  Chief Complaint  Patient presents with  . Insect Bite  . Allergic Reaction    HPI  Nancy [name] is a 69 y.o. female. who presents to the emergency room with several day history of pain and swelling of her right temporal region followed by allergic reaction to doxycycline for which she was placed on following what looks like an insect or spider bite. The patient was admitted with some serous drainage from the area and surgical services were contacted. She is allergic to sulfa and apparently doxycycline. There has been no other systemic symptoms.  Past Medical History  Diagnosis Date  . Hypothyroidism   . Diverticulosis   . Hyperlipidemia   . 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 Fibrocystic breast   . Allergy   . Nephrolithiasis     Hx of  . Abnormal Pap smear     ASCUS  . GERD (gastroesophageal reflux disease)   . Rosacea   . DDD (degenerative disc disease), lumbar   . Positive H. pylori test   . Ganglion cyst of wrist   . Cataract   . Esophagus, Barrett's     Past Surgical History  Procedure Laterality Date  . Tonsillectomy  1968  . Thyroidectomy  1972  . Abdominal hysterectomy  2005  . Bunionectomy  2010  . Laparoscopy      D&C  . Dilation and curettage of uterus    . Tubal ligation    . Breast cyst aspiration  06/2003    and 04/2004  . Colonoscopy  2006  . Bilateral salpingoophorectomy  2005  . Metatarsal osteotomy with bunionectomy Bilateral 2011  . Right breast lumpectomy    . Left breast lumpectomy    . Breast lumpectomy with radioactive seed localization Left 09/05/2014    Procedure: LEFT BREAST LUMPECTOMY WITH RADIOACTIVE SEED LOCALIZATION;  Surgeon: Excell Seltzer, MD;  Location: [facility];  Service: General;  Laterality: Left;    Family History  Problem Relation Age of Onset  . Lung cancer Father   . Melanoma Sister   . Stroke Mother   . Colon cancer  Neg Hx   . Stomach cancer Neg Hx     Social History Social History  Substance Use Topics  . Smoking status: Never Smoker   . Smokeless tobacco: Never Used  . Alcohol Use: 0.0 oz/week    2-3 Glasses of wine per week     Comment: glass of wine every other night, beer once a month    Allergies  Allergen Reactions  . Bee Venom Swelling  . Ciprofloxacin     REACTION: rash  . Latex     REACTION: rash  . Strawberry Hives  . Sulfa Drugs Cross Reactors Diarrhea  . Sulfonamide Derivatives     REACTION: rash  . Adhesive [Tape] Rash  . Metronidazole Rash    Current Facility-Administered Medications  Medication Dose Route Frequency Provider Last Rate Last Dose  . acetaminophen (TYLENOL) tablet 650 mg  650 mg Oral Q6H PRN Nicholes Mango, MD       Or  . acetaminophen (TYLENOL) suppository 650 mg  650 mg Rectal Q6H PRN Nicholes Mango, MD      . acidophilus (RISAQUAD) capsule   Oral Daily Nicholes Mango, MD   1 capsule at 12/03/14 2316  . aspirin EC tablet 81 mg  81 mg Oral Daily Aruna Gouru,  MD   81 mg at 12/03/14 2115  . calcium-vitamin D (OSCAL WITH D) 500-200 MG-UNIT per tablet 1 tablet  1 tablet Oral Daily Nicholes Mango, MD   1 tablet at 12/03/14 2317  . clindamycin (CLEOCIN) IVPB 600 mg  600 mg Intravenous 4 times per day Nicholes Mango, MD   600 mg at 12/04/14 0726  . docusate sodium (COLACE) capsule 100 mg  100 mg Oral BID Nicholes Mango, MD   100 mg at 12/03/14 2317  . enoxaparin (LOVENOX) injection 40 mg  40 mg Subcutaneous Q24H Nicholes Mango, MD   40 mg at 12/03/14 2317  . fluticasone (FLONASE) 50 MCG/ACT nasal spray 1 spray  1 spray Each Nare Daily PRN Nicholes Mango, MD      . Influenza vac split quadrivalent PF (FLUARIX) injection 0.5 mL  0.5 mL Intramuscular Tomorrow-1000 Aruna Gouru, MD      . ketotifen (ZADITOR) 0.025 % ophthalmic solution 1 drop  1 drop Both Eyes BID Nicholes Mango, MD   1 drop at 12/03/14 2200  . levothyroxine (SYNTHROID, LEVOTHROID) tablet 88 mcg  88 mcg Oral QAC breakfast Nicholes Mango, MD   88 mcg at 12/04/14 0726  . ondansetron (ZOFRAN) tablet 4 mg  4 mg Oral Q6H PRN Nicholes Mango, MD       Or  . ondansetron (ZOFRAN) injection 4 mg  4 mg Intravenous Q6H PRN Nicholes Mango, MD      . oxyCODONE (Oxy IR/ROXICODONE) immediate release tablet 5 mg  5 mg Oral Q4H PRN Nicholes Mango, MD      . pantoprazole (PROTONIX) EC tablet 40 mg  40 mg Oral Daily Aruna Gouru, MD      . simvastatin (ZOCOR) tablet 10 mg  10 mg Oral QHS Nicholes Mango, MD   10 mg at 12/03/14 2317      Review of Systems A 10 point review of systems was asked and was negative except for the following positive findingss.  Blood pressure 103/50, pulse 60, temperature 97.9 F (36.6 C), temperature source Oral, resp. rate 22, height _0  (1.626 m), weight 165 lb (74.844 kg), SpO2 96 %.  Results for orders placed or performed during the hospital encounter of 12/03/14 (from the past 48 hour(s))  CBC     Status: None   Collection Time: 12/03/14  4:36 PM  Result Value Ref Range   WBC 4.8 3.6 - 11.0 K/uL   RBC 4.54 3.80 - 5.20 MIL/uL   Hemoglobin 14.4 12.0 - 16.0 g/dL   HCT 43.3 35.0 - 47.0 %   MCV 95.4 80.0 - 100.0 fL   MCH 31.8 26.0 - 34.0 pg   MCHC 33.3 32.0 - 36.0 g/dL   RDW 12.4 11.5 - 14.5 %   Platelets 218 150 - 440 K/uL  Basic metabolic panel     Status: Abnormal   Collection Time: 12/03/14  4:36 PM  Result Value Ref Range   Sodium 140 135 - 145 mmol/L   Potassium 3.9 3.5 - 5.1 mmol/L   Chloride 108 101 - 111 mmol/L   CO2 28 22 - 32 mmol/L   Glucose, Bld 116 (H) 65 - 99 mg/dL   BUN 15 6 - 20 mg/dL   Creatinine, Ser 0.74 0.44 - 1.00 mg/dL   Calcium 8.9 8.9 - 10.3 mg/dL   GFR calc non Af Amer >60 >60 mL/min   GFR calc Af Amer >60 >60 mL/min    Comment: (NOTE) The eGFR has been calculated using the CKD  EPI equation. This calculation has not been validated in all clinical situations. eGFR's persistently <60 mL/min signify possible Chronic Kidney Disease.    Anion gap 4 (L) 5 - 15  Comprehensive  metabolic panel     Status: Abnormal   Collection Time: 12/04/14  6:09 AM  Result Value Ref Range   Sodium 142 135 - 145 mmol/L   Potassium 4.2 3.5 - 5.1 mmol/L   Chloride 107 101 - 111 mmol/L   CO2 28 22 - 32 mmol/L   Glucose, Bld 115 (H) 65 - 99 mg/dL   BUN 13 6 - 20 mg/dL   Creatinine, Ser 0.70 0.44 - 1.00 mg/dL   Calcium 8.8 (L) 8.9 - 10.3 mg/dL   Total Protein 5.9 (L) 6.5 - 8.1 g/dL   Albumin 3.8 3.5 - 5.0 g/dL   AST 38 15 - 41 U/L   ALT 71 (H) 14 - 54 U/L   Alkaline Phosphatase 80 38 - 126 U/L   Total Bilirubin 0.5 0.3 - 1.2 mg/dL   GFR calc non Af Amer >60 >60 mL/min   GFR calc Af Amer >60 >60 mL/min    Comment: (NOTE) The eGFR has been calculated using the CKD EPI equation. This calculation has not been validated in all clinical situations. eGFR's persistently <60 mL/min signify possible Chronic Kidney Disease.    Anion gap 7 5 - 15  CBC     Status: None   Collection Time: 12/04/14  6:09 AM  Result Value Ref Range   WBC 5.0 3.6 - 11.0 K/uL   RBC 4.28 3.80 - 5.20 MIL/uL   Hemoglobin 13.6 12.0 - 16.0 g/dL   HCT 40.4 35.0 - 47.0 %   MCV 94.5 80.0 - 100.0 fL   MCH 31.9 26.0 - 34.0 pg   MCHC 33.7 32.0 - 36.0 g/dL   RDW 12.3 11.5 - 14.5 %   Platelets 199 150 - 440 K/uL   Ct Maxillofacial W/cm  12/03/2014   CLINICAL DATA:  O 69 year old female with history of spider bite to the right side of the face 6 days ago, reportedly taking doxycycline presenting with hives on the face, chest and abdomen today and swelling around the right eye.  EXAM: CT MAXILLOFACIAL WITH CONTRAST  TECHNIQUE: Multidetector CT imaging of the maxillofacial structures was performed with intravenous contrast. Multiplanar CT image reconstructions were also generated. A small metallic BB was placed on the right temple in order to reliably differentiate right from left.  CONTRAST:  44m OMNIPAQUE IOHEXOL 300 MG/ML  SOLN  COMPARISON:  No priors.  FINDINGS: Generalized soft tissue swelling in the subcutaneous  fat overlying the right zygomatic arch, lateral to the right orbit and right temporal region, with associated skin thickening and diffuse infiltration of the subcutaneous fat tissues. No definable or fluid collection is identified in these regions to suggest the presence of an abscess at this time. No osseous lesions. No displaced facial bone fractures. Bilateral globes and retro-orbital soft tissues are grossly normal in appearance. Limited visualization of the intracranial contents is unremarkable. No lymphadenopathy noted. Several prominent reactive sized lymph nodes are noted, but are nonspecific.  IMPRESSION: 1. Skin thickening and infiltration of the subcutaneous fat lateral to the right orbit temporal region and right zygomatic arch, suggestive of cellulitis given the patient's history. No well-defined abscess identified at this time.   Electronically Signed   By: DVinnie LangtonM.D.   On: 12/03/2014 17:55    Review of Systems  Constitutional: Negative for  fever and chills.  Skin: Positive for rash.       Pain and swelling about the right temple. Clear drainage from the area. See history of present illness.  Neurological: Negative for dizziness and tingling.  Endo/Heme/Allergies: Negative.   Psychiatric/Behavioral: Negative.     Physical Exam  Constitutional: She is oriented to person, place, and time and well-developed, well-nourished, and in no distress. No distress.  HENT:  Head: Normocephalic and atraumatic.  Cardiovascular: Normal rate.   Pulmonary/Chest: Effort normal.  Neurological: She is oriented to person, place, and time.  Skin: She is not diaphoretic.  There is a macular papular rash on her head and neck and upper back.  He is a serpiginous area of superficial necrosis measuring 1.5 by .75 cm in size located on the right temporal area surrounded by some cellulitic changes. I can appreciate no fluctuance and abscess. There is a moderate amount of infraorbital swelling. The  conjunctiva appears to be normal.  Psychiatric: Memory, affect and judgment normal.    Data Reviewed A CT scan of the head and neck was performed demonstrating no abscess or direct extension into the sinus cavities. I have personally reviewed the patient's imaging, laboratory findings and medical records.    Assessment    Right temporal insect or arachnoid bite with localized necrosis and swelling with no signs of abscess formation.    Plan    I would add bacitracin to the wound bid.  I do not think that she needs operative intervention. She is in agreement with this and I will follow-up with her while she is in the hospital and as an outpatient.      Sherri Rad MD, FACS 12/04/2014, 8:35 AM

## 2014-12-04 NOTE — Telephone Encounter (Signed)
Pt called and talked to [facility] 2 times yesterday.  Pt has now been admitted to [facility].   [facility] Patient Name: Nancy [name] Gender: Female DOB: 1946-01-12 Age: 69 Y 2 M 15 D Return Phone Number: [phone] (Primary), [phone] (Secondary) Address: [place] [address] Client [place] [facility] - Client Client Site [facility], [place] Contact Type Call Call Type Triage / Clinical Relationship To Patient Self Return Phone Number [phone] (Primary) Chief Complaint Facial Swelling Initial Comment Caller states, has changes since speaking to the nurse, now rash in on face, eye is more swollen PreDisposition InappropriateToAsk Nurse Assessment Nurse: Wayne Sever, RN, Tillie Rung Date/Time (Eastern Time): 12/03/2014 1:46:45 PM Confirm and document reason for call. If symptomatic, describe symptoms. ---Caller talked to nurse earlier this morning. She took the Benadryl as recommended and the rash is getting worse. Caller has a spider bite on the right temple. She was started on Doxcycline on Thursday evening. Her right eye is swollen since this morning. Denies any fever. Caller is getting red bumps on her now. Has the patient traveled out of the country within the last 30 days? ---Not Applicable Does the patient have any new or worsening symptoms? ---Yes Will a triage be completed? ---Yes Related visit to physician within the last 2 weeks? ---Yes Does the PT have any chronic conditions? (i.e. diabetes, asthma, etc.) ---Yes List chronic conditions. ---Hashimoto's, GERD, High Cholesterol Guidelines Guideline Title Affirmed Question Affirmed Notes Nurse Date/Time Eilene Ghazi Time) Cellulitis on Antibiotic Follow-up Call [1] Widespread rash AND [2] bright red, Estelle June, RN, Tillie Rung 12/03/2014  1:49:40 PM Disp. Time Eilene Ghazi Time) Disposition Final User 12/03/2014 1:51:33 PM Go to ED Now Yes Wayne Sever, RN, Mable Paris Understands: Yes Disagree/Comply: Comply PLEASE NOTE: All timestamps contained within this report are represented as [place] Standard Time. CONFIDENTIALTY NOTICE: This fax transmission is intended only for the addressee. It contains information that is legally privileged, confidential or otherwise protected from use or disclosure. If you are not the intended recipient, you are strictly prohibited from reviewing, disclosing, copying using or disseminating any of this information or taking any action in reliance on or regarding this information. If you have received this fax in error, please notify us immediately by telephone so that we can arrange for its return to [place]. Phone: [phone], Toll-Free: [phone], Fax: [phone] Page: 2 of 2 Call Id: [id number] Care Advice Given Per Guideline GO TO ED NOW: You need to be seen in the Emergency Department. Go to the ER at ___________ [facility] now. Drive carefully. CARE ADVICE given per Cellulitis on Antibiotic Follow-Up Call (Adult) guideline. After Care Instructions Given Call Event Type User Date / Time Description Referrals GO TO FACILITY UNDECIDED   [place] [facility] Patient Name: Nancy [name] Gender: Female DOB: [birth year] Age: 50 Y 2 M 15 D Return Phone Number: [phone] (Primary), [phone] (Secondary) Address: [place] [address] Client [place] Night - Client Client Site [facility] [place] Contact Type Call Call Type Triage / Clinical Relationship To Patient Self Return Phone Number [phone] (Primary) Chief Complaint Medication reaction Initial Comment Caller states, saw the Dr on [address] for spider bite, was given  antibiotic, now has hives and eye is swollen shut PreDisposition Go to ED  Nurse Assessment Nurse: Malva Cogan, RN, Juliann Pulse Date/Time Eilene Ghazi Time): 12/03/2014 7:55:15 AM Confirm and document reason for call. If symptomatic, describe symptoms. ---Caller states that she was bitten by a spider on her (R) temple, unknown time, was seen by physician on Thursday & was prescribed Doxycycline. Caller states that she has developed widespread hives over night last night & her (R) eye is swollen shut but affected eye is on same side as spider bite. Hives are not associated with itching. Has the patient traveled out of the country within the last 30 days? ---No Does the patient have any new or worsening symptoms? ---Yes Will a triage be completed? ---Yes Related visit to physician within the last 2 weeks? ---No not for hives Does the PT have any chronic conditions? (i.e. diabetes, asthma, etc.) ---Yes List chronic conditions. ---acid reflux, Hashimoto's, low cholesterol Guidelines Guideline Title Affirmed Question Affirmed Notes Nurse Date/Time (Eastern Time) Rash - Widespread On Drugs Hives or itching Carola Rhine 12/03/2014 7:58:47 AM Disp. Time Eilene Ghazi Time) Disposition Final User 12/03/2014 8:04:11 AM See Physician within 24 Hours Yes Malva Cogan, RN, Gara Kroner Understands: Yes PLEASE NOTE: All timestamps contained within this report are represented as [place] Standard Time. CONFIDENTIALTY NOTICE: This fax transmission is intended only for the addressee. It contains information that is legally privileged, confidential or otherwise protected from use or disclosure. If you are not the intended recipient, you are strictly prohibited from reviewing, disclosing, copying using or disseminating any of this information or taking any action in reliance on or regarding this information. If you have received this fax in error, please notify us immediately by telephone so that we can arrange for its  return to [place]. Phone: [phone], Toll-Free: [phone], Fax: [phone] Page: 2 of 2 Call Id: [id number] Disagree/Comply: Comply Care Advice Given Per Guideline SEE PHYSICIAN WITHIN 24 HOURS: * IF OFFICE WILL BE OPEN: You need to be seen within the next 24 hours. Call your doctor when the office opens, and make an appointment. STOP THE MEDICATION: If the patient has severe hives or itching, suspect a drug allergy and stop the medicine until examined. ANTIHISTAMINE FOR HIVES OR SEVERE ITCHING: * Take an antihistamine by mouth. Diphenhydramine (OTC Benadryl) is a good choice. Adult dose is 25-50 mg. Take it up to 4 times a day. * If the rash or itching is mild, do not take an antihistamine during the 8 hours before seeing the doctor (Reason: so the doctor can see the full-blown rash). CAUTION - ANTIHISTAMINES: * Examples include diphenhydramine (Benadryl) and chlorpheniramine (Chlortrimeton, Chlor-tripolon) * May cause sleepiness. Do not drink alcohol, drive or operate dangerous machinery while taking antihistamines. * Read the package instructions and warnings. COOL BATH FOR ITCHING: * For flare-ups of itching, you can try taking a cool bath for 10 minutes. Caution: avoid any chill. Optional: add 2 oz of baking soda to the tub. * You can also rub very itchy areas with an ice cube for 10 minutes. OATMEAL AVEENO BATH FOR ITCHING: * Sprinkle contents of one Aveeno packet under running faucet with comfortably warm water. Bathe for 15 - 20 minutes, 1-2 times daily. * Pat dry using towel; do not rub. CALL BACK IF: * You become worse. CARE ADVICE given per Rash - Widespread on Drugs (Adult) guideline After Care Instructions Given Call Event Type User Date / Time Description Referrals REFERRED TO PCP OFFICE

## 2014-12-05 MED ORDER — CLINDAMYCIN HCL 300 MG PO CAPS: 300.0000 mg | ORAL_CAPSULE | Freq: Three times a day (TID) | ORAL | Status: DC

## 2014-12-05 MED ORDER — MUPIROCIN 2 % EX OINT: TOPICAL_OINTMENT | Freq: Two times a day (BID) | CUTANEOUS | Status: DC

## 2014-12-05 NOTE — Progress Notes (Signed)
Patient ID: Nancy [name], female   DOB: 11-19-1945, 69 y.o.   MRN: [id number]   Surgery consult  Patient complaining of increased pain around her face. She has somewhat of a headache on the right side.  Filed Vitals:   12/04/14 0518 12/04/14 1239 12/04/14 2118 12/05/14 0528  BP: 103/50 132/58 113/59 101/49  Pulse: 60 89 65 67  Temp: 97.9 F (36.6 C) 98.1 F (36.7 C) 98.4 F (36.9 C) 98.4 F (36.9 C)  TempSrc: Oral Oral Oral Oral  Resp: 22  17   Height:      Weight:      SpO2: 96% 77% 96% 95%   Physical examination the patient is alert and oriented 4. Lungs are clear  Examination of the temporal area on the right demonstrates much less cellulitis compared to yesterday. The area of superficial necrosis has increased in its demarcation.  There is no drainable abscess.  Impression right facial cellulitis improving status post insect versus arachnid bite.  At present there is no surgical indication.  Plan I agree with infectious disease consultation.   Call with any questions I will sign off.

## 2014-12-05 NOTE — Discharge Instructions (Signed)

## 2014-12-05 NOTE — Discharge Summary (Signed)
[facility] NAME: Nancy [name]    MR#:  [id number]  DATE OF BIRTH:  16-Jan-1946  DATE OF ADMISSION:  12/03/2014 ADMITTING PHYSICIAN: Nicholes Mango, MD  DATE OF DISCHARGE: 12/05/2014  2:06 PM  PRIMARY CARE PHYSICIAN: Loura Pardon, MD    ADMISSION DIAGNOSIS:  Facial cellulitis [V25.366]  DISCHARGE DIAGNOSIS:  Active Problems:   Facial cellulitis   SECONDARY DIAGNOSIS:   Past Medical History  Diagnosis Date  . Hypothyroidism   . Diverticulosis   . Hyperlipidemia   . 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 Fibrocystic breast   . Allergy   . Nephrolithiasis     Hx of  . Abnormal Pap smear     ASCUS  . GERD (gastroesophageal reflux disease)   . Rosacea   . DDD (degenerative disc disease), lumbar   . Positive H. pylori test   . Ganglion cyst of wrist   . Cataract   . Esophagus, Barrett's     HOSPITAL COURSE:   #1 right facial cellulitis status post insect bite probably spider-patient presented with right-sided facial redness and swelling and CT scan findings suggestive of cellulitis. -Patient was on oral doxycycline but was not improving and therefore was admitted to the hospital started on IV Clindamycin. Surgical consult was obtained and did not think that the patient needed any further debridement or surgical intervention. -Patient's redness and swelling did improve with IV clindamycin and therefore she was discharged on oral clindamycin. Patient will follow-up with infectious disease as an outpatient in next week.  #2 history of hypothyroidism secondary to Hashimoto's thyroiditis -Patient will continue her Synthroid  #3 hyperlipidemia Pt. Will Continue her home medication Zocor  #4 fibrocystic breast disease Pt. Will Status post surgical removal of the cyst which was benign  #5 GERD -Patient will continue her omeprazole.  #6 rash-patient developed a vesicular lesions near her cellulitic rash and also on her  back. It almost looked herpetic but not in a dermatomal pattern consistent with shingles. They're not itchy or burning. -This could possibly be a result of her insect bite/cellulitis. She will continue follow-up with her primary as outpatient. She's also been referred to see infectious disease as an outpatient.  DISCHARGE CONDITIONS:   Stable  CONSULTS OBTAINED:  Treatment Team:  Nicholes Mango, MD Fritzi Mandes, MD Adrian Prows, MD  DRUG ALLERGIES:   Allergies  Allergen Reactions  . Bee Venom Swelling  . Ciprofloxacin     REACTION: rash  . Latex     REACTION: rash  . Strawberry Hives  . Sulfa Drugs Cross Reactors Diarrhea  . Sulfonamide Derivatives     REACTION: rash  . Adhesive [Tape] Rash  . Metronidazole Rash    DISCHARGE MEDICATIONS:   Discharge Medication List as of 12/05/2014  1:33 PM    START taking these medications   Details  clindamycin (CLEOCIN) 300 MG capsule Take 1 capsule (300 mg total) by mouth 3 (three) times daily., Starting 12/05/2014, Until Discontinued, Print    mupirocin ointment (BACTROBAN) 2 % Apply topically 2 (two) times daily., Starting 12/05/2014, Until Discontinued, Print      CONTINUE these medications which have NOT CHANGED   Details  aspirin EC 81 MG tablet Take 81 mg by mouth daily., Until Discontinued, Historical Med    calcium-vitamin D (OSCAL WITH D) 500-200 MG-UNIT per tablet Take 1 tablet by mouth daily., Until Discontinued, Historical Med    fluticasone (FLONASE) 50 MCG/ACT  nasal spray Place 1 spray into the nose daily as needed for allergies or rhinitis. , Until Discontinued, Historical Med    Ketotifen Fumarate (ZADITOR OP) Apply 1 drop to eye daily as needed (for allergies.). , Until Discontinued, Historical Med    levothyroxine (SYNTHROID, LEVOTHROID) 88 MCG tablet Take 88 mcg by mouth daily. Patients takes brand name., Until Discontinued, Historical Med    Multiple Vitamins-Minerals (CENTRUM SILVER ULTRA WOMENS PO) Take 1  tablet by mouth daily., Until Discontinued, Historical Med    Omega-3 Fatty Acids (FISH OIL) 500 MG CAPS Take 1 capsule by mouth daily., Until Discontinued, Historical Med    omeprazole (PRILOSEC) 40 MG capsule Take 1 capsule (40 mg total) by mouth daily., Starting 03/06/2014, Until Discontinued, Normal    Polyethyl Glycol-Propyl Glycol (SYSTANE) 0.4-0.3 % SOLN Apply 1 drop to eye daily as needed (for allergies.). , Until Discontinued, Historical Med    Probiotic Product (PROBIOTIC DAILY PO) Take 1 capsule by mouth daily. , Until Discontinued, Historical Med    simvastatin (ZOCOR) 10 MG tablet Take 1 tablet (10 mg total) by mouth daily., Starting 03/06/2014, Until Discontinued, Normal      STOP taking these medications     doxycycline (VIBRA-TABS) 100 MG tablet      aspirin 81 MG tablet          DISCHARGE INSTRUCTIONS:   DIET:  Cardiac diet  DISCHARGE CONDITION:  Stable  ACTIVITY:  Activity as tolerated  OXYGEN:  Home Oxygen: No.   Oxygen Delivery: room air  DISCHARGE LOCATION:  home   If you experience worsening of your admission symptoms, develop shortness of breath, life threatening emergency, suicidal or homicidal thoughts you must seek medical attention immediately by calling 911 or calling your MD immediately  if symptoms less severe.  You Must read complete instructions/literature along with all the possible adverse reactions/side effects for all the Medicines you take and that have been prescribed to you. Take any new Medicines after you have completely understood and accpet all the possible adverse reactions/side effects.   Please note  You were cared for by a hospitalist during your hospital stay. If you have any questions about your discharge medications or the care you received while you were in the hospital after you are discharged, you can call the unit and asked to speak with the hospitalist on call if the hospitalist that took care of you is not  available. Once you are discharged, your primary care physician will handle any further medical issues. Please note that NO REFILLS for any discharge medications will be authorized once you are discharged, as it is imperative that you return to your primary care physician (or establish a relationship with a primary care physician if you do not have one) for your aftercare needs so that they can reassess your need for medications and monitor your lab values.     Today   No fever, nausea, vomiting. Right facial swelling and redness much improved.  VITAL SIGNS:  Blood pressure 119/54, pulse 72, temperature 98.4 F (36.9 C), temperature source Oral, resp. rate 18, height 5\' 4"  (1.626 m), weight 74.844 kg (165 lb), SpO2 97 %.  I/O:   Intake/Output Summary (Last 24 hours) at 12/05/14 1630 Last data filed at 12/05/14 1240  Gross per 24 hour  Intake   1136 ml  Output   2950 ml  Net  -1814 ml    PHYSICAL EXAMINATION:  GENERAL:  69 y.o.-year-old patient lying in the bed with no  acute distress.  EYES: Pupils equal, round, reactive to light and accommodation. No scleral icterus. Extraocular muscles intact.  HEENT: Head atraumatic, normocephalic. Oropharynx and nasopharynx clear.  NECK:  Supple, no jugular venous distention. No thyroid enlargement, no tenderness.  LUNGS: Normal breath sounds bilaterally, no wheezing, rales,rhonchi. No use of accessory muscles of respiration.  CARDIOVASCULAR: S1, S2 normal. No murmurs, rubs, or gallops.  ABDOMEN: Soft, non-tender, non-distended. Bowel sounds present. No organomegaly or mass.  EXTREMITIES: No pedal edema, cyanosis, or clubbing.  NEUROLOGIC: Cranial nerves II through XII are intact. No focal motor or sensory defecits b/l.  PSYCHIATRIC: The patient is alert and oriented x 3. Good affect.  SKIN: Patient has a of right forehead ulcer with no acute drainage. She has mild erythema and induration around it. She has small vesicular areas around the  lesion. She was also noted to have some vesicular areas on her upper back and her right arm which are not draining.  DATA REVIEW:   CBC  Recent Labs Lab 12/04/14 0609  WBC 5.0  HGB 13.6  HCT 40.4  PLT 199    Chemistries   Recent Labs Lab 12/04/14 0609  NA 142  K 4.2  CL 107  CO2 28  GLUCOSE 115*  BUN 13  CREATININE 0.70  CALCIUM 8.8*  AST 38  ALT 71*  ALKPHOS 80  BILITOT 0.5    Cardiac Enzymes No results for input(s): TROPONINI in the last 168 hours.  Microbiology Results  No results found for this or any previous visit.  RADIOLOGY:  Ct Maxillofacial W/cm  12/03/2014   CLINICAL DATA:  O 69 year old female with history of spider bite to the right side of the face 6 days ago, reportedly taking doxycycline presenting with hives on the face, chest and abdomen today and swelling around the right eye.  EXAM: CT MAXILLOFACIAL WITH CONTRAST  TECHNIQUE: Multidetector CT imaging of the maxillofacial structures was performed with intravenous contrast. Multiplanar CT image reconstructions were also generated. A small metallic BB was placed on the right temple in order to reliably differentiate right from left.  CONTRAST:  67mL OMNIPAQUE IOHEXOL 300 MG/ML  SOLN  COMPARISON:  No priors.  FINDINGS: Generalized soft tissue swelling in the subcutaneous fat overlying the right zygomatic arch, lateral to the right orbit and right temporal region, with associated skin thickening and diffuse infiltration of the subcutaneous fat tissues. No definable or fluid collection is identified in these regions to suggest the presence of an abscess at this time. No osseous lesions. No displaced facial bone fractures. Bilateral globes and retro-orbital soft tissues are grossly normal in appearance. Limited visualization of the intracranial contents is unremarkable. No lymphadenopathy noted. Several prominent reactive sized lymph nodes are noted, but are nonspecific.  IMPRESSION: 1. Skin thickening and  infiltration of the subcutaneous fat lateral to the right orbit temporal region and right zygomatic arch, suggestive of cellulitis given the patient's history. No well-defined abscess identified at this time.   Electronically Signed   By: Vinnie Langton M.D.   On: 12/03/2014 17:55      Management plans discussed with the patient, family and they are in agreement.  CODE STATUS:     Code Status Orders        Start     Ordered   12/03/14 2110  Full code   Continuous     12/03/14 2109      TOTAL TIME TAKING CARE OF THIS PATIENT: 40 minutes.    [place]  on 12/05/2014 at 4:30 PM  Between 7am to 6pm - Pager - [phone]  After 6pm go to [email or web address] - password [email or web address] [facility]  [facility]  Office  [phone]  CC: Primary care physician; Loura Pardon, MD

## 2014-12-05 NOTE — Progress Notes (Signed)
Pt A and O x 4. VSS. Pt tolerating diet well. No complaints of pain or nausea. IV removed intact, prescriptions given. Pt still has wound on right side of temporal. Will follow up out patient. Pt voiced understanding of discharge instructions with no further questions. Pt discharged via wheelchair with axillary.

## 2014-12-06 ENCOUNTER — Encounter: Payer: Self-pay | Admitting: Family Medicine

## 2014-12-06 ENCOUNTER — Ambulatory Visit (INDEPENDENT_AMBULATORY_CARE_PROVIDER_SITE_OTHER): Payer: Medicare PPO | Admitting: Family Medicine

## 2014-12-06 ENCOUNTER — Telehealth: Payer: Self-pay | Admitting: *Deleted

## 2014-12-06 VITALS — BP 142/80 | HR 77 | Temp 98.1°F | Ht 63.5 in | Wt 166.8 lb

## 2014-12-06 DIAGNOSIS — R21 Rash and other nonspecific skin eruption: Secondary | ICD-10-CM

## 2014-12-06 DIAGNOSIS — L03211 Cellulitis of face: Secondary | ICD-10-CM

## 2014-12-06 NOTE — Patient Instructions (Signed)
Continue your cleocin  Cleanse wound with soap and water and continue bactroban  Please stop at check out for referral to infectious disease   If symptoms worsen in the meantime please alert [place]

## 2014-12-06 NOTE — Progress Notes (Signed)
Pre visit review using our clinic review tool, if applicable. No additional management support is needed unless otherwise documented below in the visit note. 

## 2014-12-06 NOTE — Telephone Encounter (Signed)
Transition Care Management Follow-up Telephone Call   Date discharged? 12/05/14   How have you been since you were released from the hospital? Still not 100%, but much improved.   Do you understand why you were in the hospital? yes   Do you understand the discharge instructions? yes   Where were you discharged to? Home   Items Reviewed:  Medications reviewed: yes  Allergies reviewed: yes  Dietary changes reviewed: no  Referrals reviewed: yes, infectious disease   Functional Questionnaire:   Activities of Daily Living (ADLs):   She states they are independent in the following: ambulation, bathing and hygiene, feeding, continence, grooming, toileting and dressing States they require assistance with the following: None   Any transportation issues/concerns?: no   Any patient concerns? no   Confirmed importance and date/time of follow-up visits scheduled yes, 12/06/14 @ 1545  Provider Appointment booked with Loura Pardon, MD  Confirmed with patient if condition begins to worsen call PCP or go to the ER.  Patient was given the office number and encouraged to call back with question or concerns.  : yes

## 2014-12-06 NOTE — Progress Notes (Signed)
Subjective:    Patient ID: Nancy [name], female    DOB: 01-24-1946, 69 y.o.   MRN: [id number]  HPI Here for transitional care visit after hospitalization from 10/9-10/11 for R sided facial cellulitis  Had been tx outpt with doxycycline to begin with / suspected insect bite Pt states it started one week ago with a spot on her temple - felt like a mosquito bite  By Thursday got worse - saw Allie Bossier and was treated  Had some pain in roof of mouth-dentist adv use of aleve  Then by Sunday -her eye was swollen and developed red blotches (thought hives- nurse line suggested benadryl which she tried)  Worsened and admitted to hospital   tx with clindamycin and improved Had CT scan  Imaging Results (Last 48 hours)    Ct Maxillofacial W/cm  12/03/2014 CLINICAL DATA: O 69 year old female with history of spider bite to the right side of the face 6 days ago, reportedly taking doxycycline presenting with hives on the face, chest and abdomen today and swelling around the right eye. EXAM: CT MAXILLOFACIAL WITH CONTRAST TECHNIQUE: Multidetector CT imaging of the maxillofacial structures was performed with intravenous contrast. Multiplanar CT image reconstructions were also generated. A small metallic BB was placed on the right temple in order to reliably differentiate right from left. CONTRAST: 38mL OMNIPAQUE IOHEXOL 300 MG/ML SOLN COMPARISON: No priors. FINDINGS: Generalized soft tissue swelling in the subcutaneous fat overlying the right zygomatic arch, lateral to the right orbit and right temporal region, with associated skin thickening and diffuse infiltration of the subcutaneous fat tissues. No definable or fluid collection is identified in these regions to suggest the presence of an abscess at this time. No osseous lesions. No displaced facial bone fractures. Bilateral globes and retro-orbital soft tissues are grossly normal in appearance. Limited visualization of the intracranial contents  is unremarkable. No lymphadenopathy noted. Several prominent reactive sized lymph nodes are noted, but are nonspecific. IMPRESSION: 1. Skin thickening and infiltration of the subcutaneous fat lateral to the right orbit temporal region and right zygomatic arch, suggestive of cellulitis given the patient's history. No well-defined abscess identified at this time. Electronically Signed By: Vinnie Langton M.D. On: 12/03/2014 17:55         Had a surgical consult while there - had her start bactroban  Ordered ID consult -and one was not available   Was d/c home  (of note pt had a bad experience at [facility] and would not go back) Lab at d/c   Results for orders placed or performed during the hospital encounter of 12/03/14  CBC  Result Value Ref Range   WBC 4.8 3.6 - 11.0 K/uL   RBC 4.54 3.80 - 5.20 MIL/uL   Hemoglobin 14.4 12.0 - 16.0 g/dL   HCT 43.3 35.0 - 47.0 %   MCV 95.4 80.0 - 100.0 fL   MCH 31.8 26.0 - 34.0 pg   MCHC 33.3 32.0 - 36.0 g/dL   RDW 12.4 11.5 - 14.5 %   Platelets 218 150 - 440 K/uL  Basic metabolic panel  Result Value Ref Range   Sodium 140 135 - 145 mmol/L   Potassium 3.9 3.5 - 5.1 mmol/L   Chloride 108 101 - 111 mmol/L   CO2 28 22 - 32 mmol/L   Glucose, Bld 116 (H) 65 - 99 mg/dL   BUN 15 6 - 20 mg/dL   Creatinine, Ser 0.74 0.44 - 1.00 mg/dL   Calcium 8.9 8.9 - 10.3 mg/dL  GFR calc non Af Amer >60 >60 mL/min   GFR calc Af Amer >60 >60 mL/min   Anion gap 4 (L) 5 - 15  Comprehensive metabolic panel  Result Value Ref Range   Sodium 142 135 - 145 mmol/L   Potassium 4.2 3.5 - 5.1 mmol/L   Chloride 107 101 - 111 mmol/L   CO2 28 22 - 32 mmol/L   Glucose, Bld 115 (H) 65 - 99 mg/dL   BUN 13 6 - 20 mg/dL   Creatinine, Ser 0.70 0.44 - 1.00 mg/dL   Calcium 8.8 (L) 8.9 - 10.3 mg/dL   Total Protein 5.9 (L) 6.5 - 8.1 g/dL   Albumin 3.8 3.5 - 5.0 g/dL   AST 38 15 - 41 U/L   ALT 71 (H) 14 - 54 U/L   Alkaline Phosphatase 80 38 - 126 U/L   Total Bilirubin 0.5 0.3 -  1.2 mg/dL   GFR calc non Af Amer >60 >60 mL/min   GFR calc Af Amer >60 >60 mL/min   Anion gap 7 5 - 15  CBC  Result Value Ref Range   WBC 5.0 3.6 - 11.0 K/uL   RBC 4.28 3.80 - 5.20 MIL/uL   Hemoglobin 13.6 12.0 - 16.0 g/dL   HCT 40.4 35.0 - 47.0 %   MCV 94.5 80.0 - 100.0 fL   MCH 31.9 26.0 - 34.0 pg   MCHC 33.7 32.0 - 36.0 g/dL   RDW 12.3 11.5 - 14.5 %   Platelets 199 150 - 440 K/uL  Glucose, capillary  Result Value Ref Range   Glucose-Capillary 126 (H) 65 - 99 mg/dL   Comment 1 Notify RN     Did develop a rash on her back prior to d/c  Eye is more swollen in the am  Is improving    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 Facial cellulitis 12/03/2014  . Colon cancer screening 03/06/2014  . Ganglion cyst of wrist 03/06/2014  . Encounter for Medicare annual wellness exam 01/24/2013  . Positive H. pylori test 03/26/2012  . H/O abnormal Pap smear 05/06/2011  . GERD (gastroesophageal reflux disease) 07/16/2010  . BACK PAIN 09/06/2009  . NEPHROLITHIASIS, HX OF 09/06/2009  . Hyperglycemia 08/22/2009  . POSTMENOPAUSAL STATUS 08/29/2008  . Hypothyroidism 05/18/2006  . Hyperlipidemia 05/18/2006  . DIVERTICULOSIS, COLON 05/18/2006  . RENAL CALCULUS 05/18/2006  . FIBROCYSTIC BREAST DISEASE 05/18/2006  . ROSACEA 05/18/2006  . DEGENERATIVE DISC DISEASE 05/18/2006  . ALLERGY 05/18/2006   Past Medical History  Diagnosis Date  . Hypothyroidism   . Diverticulosis   . Hyperlipidemia   . 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 Fibrocystic breast   . Allergy   . Nephrolithiasis     Hx of  . Abnormal Pap smear     ASCUS  . GERD (gastroesophageal reflux disease)   . Rosacea   . DDD (degenerative disc disease), lumbar   . Positive H. pylori test   . Ganglion cyst of wrist   . Cataract     . Esophagus, Barrett's    Past Surgical History  Procedure Laterality Date  . Tonsillectomy  1968  . Thyroidectomy  1972  . Abdominal hysterectomy  2005  . Bunionectomy  2010  .  Laparoscopy      D&C  . Dilation and curettage of uterus    . Tubal ligation    . Breast cyst aspiration  06/2003    and 04/2004  . Colonoscopy  2006  . Bilateral salpingoophorectomy  2005  . Metatarsal osteotomy with bunionectomy Bilateral 2011  . Right breast lumpectomy    . Left breast lumpectomy    . Breast lumpectomy with radioactive seed localization Left 09/05/2014    Procedure: LEFT BREAST LUMPECTOMY WITH RADIOACTIVE SEED LOCALIZATION;  Surgeon: Excell Seltzer, MD;  Location: [place];  Service: General;  Laterality: Left;   Social History  Substance Use Topics  . Smoking status: Never Smoker   . Smokeless tobacco: Never Used  . Alcohol Use: 0.0 oz/week    2-3 Glasses of wine per week     Comment: glass of wine every other night, beer once a month   Family History  Problem Relation Age of Onset  . Lung cancer Father   . Melanoma Sister   . Stroke Mother   . Colon cancer Neg Hx   . Stomach cancer Neg Hx    Allergies  Allergen Reactions  . Bee Venom Swelling  . Ciprofloxacin     REACTION: rash  . Latex     REACTION: rash  . Strawberry Hives  . Sulfa Drugs Cross Reactors Diarrhea  . Sulfonamide Derivatives     REACTION: rash  . Adhesive [Tape] Rash  . Metronidazole Rash   Current Outpatient Prescriptions on File Prior to Visit  Medication Sig Dispense Refill  . aspirin EC 81 MG tablet Take 81 mg by mouth daily.    . calcium-vitamin D (OSCAL WITH D) 500-200 MG-UNIT per tablet Take 1 tablet by mouth daily.    . clindamycin (CLEOCIN) 300 MG capsule Take 1 capsule (300 mg total) by mouth 3 (three) times daily. 21 capsule 0  . fluticasone (FLONASE) 50 MCG/ACT nasal spray Place 1 spray into the nose daily as needed for allergies or rhinitis.     Marland Kitchen Ketotifen  Fumarate (ZADITOR OP) Apply 1 drop to eye daily as needed (for allergies.).     Marland Kitchen levothyroxine (SYNTHROID, LEVOTHROID) 88 MCG tablet Take 88 mcg by mouth daily. Patients takes brand name.    . Multiple Vitamins-Minerals (CENTRUM SILVER ULTRA WOMENS PO) Take 1 tablet by mouth daily.    . mupirocin ointment (BACTROBAN) 2 % Apply topically 2 (two) times daily. 22 g 0  . Omega-3 Fatty Acids (FISH OIL) 500 MG CAPS Take 1 capsule by mouth daily.    Marland Kitchen omeprazole (PRILOSEC) 40 MG capsule Take 1 capsule (40 mg total) by mouth daily. 30 capsule 11  . Polyethyl Glycol-Propyl Glycol (SYSTANE) 0.4-0.3 % SOLN Apply 1 drop to eye daily as needed (for allergies.).     Marland Kitchen Probiotic Product (PROBIOTIC DAILY PO) Take 1 capsule by mouth daily.     . simvastatin (ZOCOR) 10 MG tablet Take 1 tablet (10 mg total) by mouth daily. 30 tablet 11   No current facility-administered medications on file prior to visit.    Review of Systems Review of Systems  Constitutional: Negative for fever, appetite change, and unexpected weight change. pos for fatigue  Eyes: Negative for pain and visual disturbance.  Respiratory: Negative for cough and shortness of breath.   Cardiovascular: Negative for cp or palpitations    Gastrointestinal: Negative for nausea, diarrhea and constipation.  Genitourinary: Negative for urgency and frequency.  Skin: Negative for pallor and pos  for rash on face and back, pos for cellulitis on face  Neurological: Negative for weakness, light-headedness, numbness and headaches.  Hematological: Negative for adenopathy. Does not bruise/bleed easily.  Psychiatric/Behavioral: Negative for dysphoric mood. The patient is not nervous/anxious.         Objective:   Physical Exam  Constitutional: She appears well-developed and well-nourished. No distress.  obese and well appearing   HENT:  Head: Normocephalic.  Right Ear: External ear normal.  Left Ear: External ear normal.  Mouth/Throat: Oropharynx is  clear and moist.  See skin exam   Eyes: Conjunctivae and EOM are normal. Pupils are equal, round, and reactive to light. Right eye exhibits no discharge. Left eye exhibits no discharge. No scleral icterus.  Neck: Normal range of motion. Neck supple. No JVD present. No tracheal deviation present.  Cardiovascular: Normal rate and regular rhythm.   Pulmonary/Chest: Effort normal and breath sounds normal. No respiratory distress. She has no wheezes. She has no rales.  Abdominal: Soft. Bowel sounds are normal.  Musculoskeletal: She exhibits no edema or tenderness.  Lymphadenopathy:    She has cervical adenopathy.  Neurological: She is alert. She has normal reflexes. No cranial nerve deficit.  Skin: Skin is warm and dry. Rash noted. There is erythema.  Erythema over R temple and surrounding wound 2-3 cm oval wound with dark eschar and no drainage   Swelling under R eye  Minimal tenderness  Erythematous papules scattered on face and back without vesicles/pustules   Psychiatric: She has a normal mood and affect.          Assessment & Plan:   Problem List Items Addressed This Visit      Musculoskeletal and Integument   Rash and nonspecific skin eruption    Unsure if related to current cellulitis or if rxn to doxycycline (rash on face and back- papules)  Continue to cleanse with gentle soap and water  Update if worse/itchy/pain or other change  Ref to ID was made       Relevant Orders   Ambulatory referral to Infectious Disease     Other   Facial cellulitis - Primary    S/p hospitalization with IV clindamycin-now d/c on oral clinda , slowly improving with complication of rash which may or may not be involved  Since ID was unable to see her in the hospital- a referral was made  hypoth a spider bite caused it  inst to continue cleansing and using bactroban Rev hosp records/CT/labs/surgical consult and d/c in detail today  Pt will update if any worsening or fever       Relevant  Orders   Ambulatory referral to Infectious Disease

## 2014-12-07 NOTE — Assessment & Plan Note (Addendum)
Unsure if related to current cellulitis or if rxn to doxycycline (rash on face and back- papules)  Continue to cleanse with gentle soap and water  Update if worse/itchy/pain or other change  Ref to ID was made

## 2014-12-07 NOTE — Assessment & Plan Note (Signed)
S/p hospitalization with IV clindamycin-now d/c on oral clinda , slowly improving with complication of rash which may or may not be involved  Since ID was unable to see her in the hospital- a referral was made  hypoth a spider bite caused it  inst to continue cleansing and using bactroban Rev hosp records/CT/labs/surgical consult and d/c in detail today  Pt will update if any worsening or fever

## 2014-12-11 ENCOUNTER — Ambulatory Visit (INDEPENDENT_AMBULATORY_CARE_PROVIDER_SITE_OTHER): Payer: Medicare PPO | Admitting: Internal Medicine

## 2014-12-11 ENCOUNTER — Encounter: Payer: Self-pay | Admitting: Internal Medicine

## 2014-12-11 VITALS — BP 131/75 | HR 81 | Temp 98.0°F | Ht 64.0 in | Wt 168.0 lb

## 2014-12-11 DIAGNOSIS — Z23 Encounter for immunization: Secondary | ICD-10-CM | POA: Diagnosis not present

## 2014-12-11 DIAGNOSIS — R195 Other fecal abnormalities: Secondary | ICD-10-CM

## 2014-12-11 DIAGNOSIS — L03211 Cellulitis of face: Secondary | ICD-10-CM | POA: Diagnosis not present

## 2014-12-11 DIAGNOSIS — L509 Urticaria, unspecified: Secondary | ICD-10-CM | POA: Diagnosis not present

## 2014-12-11 MED ORDER — CLINDAMYCIN HCL 300 MG PO CAPS: 300.0000 mg | ORAL_CAPSULE | Freq: Three times a day (TID) | ORAL | Status: DC

## 2014-12-11 NOTE — Progress Notes (Signed)
Rfv: facial cellulitis Subjective:    Patient ID: Nancy [name], female    DOB: 1945-12-02, 69 y.o.   MRN: [id number]  HPI  69yo F with history of hypothyroidism, HLD, who roughly 2.5 wks ago noted to have tender swelling firm area to her right temple. She initially placed alcohol on it to clean it but still continue to grow in size and have spreading erythema. She tried a course of neosporin that too appeared to cause worsening erythema. She was seen by PCP who started a course of doxycycline for presumed staph cellulitis. After 3 days of doxycycline, the patient states that he face still continued to have significant swelling that now involved her cheek and swelling of her eyelids where she was not able to see. Concurrently she started to have raised welts to her forehead, chest that were non-pruritic. The swelling or rash did not improve with benadryl. She was directed to go to the ED for evaluation. She was admitted to [facility] for management. She underwent facial CT that showed swelling to right side of face but no areas that needed debridement. She was evaluated by general surgery who observed the response to treatement as she was changed to clindamycin IV and topical mupirocin. She is currently on day 8 of clindamycin. Her facial cellulitis is nearly completely resolved. She does have a 2cm eschar, healing in the affected area, and has slight erythema around the healing eschar. Denies any fever, chills, slight loose stool, taking probiotics.  i have reviewed her outside records that detail the information she is reporting during this visit.  Allergies  Allergen Reactions  . Bee Venom Swelling  . Ciprofloxacin     REACTION: rash  . Latex     REACTION: rash  . Strawberry Hives  . Sulfa Drugs Cross Reactors Diarrhea  . Sulfonamide Derivatives     REACTION: rash  . Adhesive [Tape] Rash  . Metronidazole Rash   Current Outpatient Prescriptions on File Prior to Visit  Medication Sig Dispense  Refill  . aspirin EC 81 MG tablet Take 81 mg by mouth daily.    . calcium-vitamin D (OSCAL WITH D) 500-200 MG-UNIT per tablet Take 1 tablet by mouth daily.    . fluticasone (FLONASE) 50 MCG/ACT nasal spray Place 1 spray into the nose daily as needed for allergies or rhinitis.     Marland Kitchen Ketotifen Fumarate (ZADITOR OP) Apply 1 drop to eye daily as needed (for allergies.).     Marland Kitchen levothyroxine (SYNTHROID, LEVOTHROID) 88 MCG tablet Take 88 mcg by mouth daily. Patients takes brand name.    . Multiple Vitamins-Minerals (CENTRUM SILVER ULTRA WOMENS PO) Take 1 tablet by mouth daily.    . mupirocin ointment (BACTROBAN) 2 % Apply topically 2 (two) times daily. 22 g 0  . Omega-3 Fatty Acids (FISH OIL) 500 MG CAPS Take 1 capsule by mouth daily.    Marland Kitchen omeprazole (PRILOSEC) 40 MG capsule Take 1 capsule (40 mg total) by mouth daily. 30 capsule 11  . Polyethyl Glycol-Propyl Glycol (SYSTANE) 0.4-0.3 % SOLN Apply 1 drop to eye daily as needed (for allergies.).     Marland Kitchen Probiotic Product (PROBIOTIC DAILY PO) Take 1 capsule by mouth daily.     . simvastatin (ZOCOR) 10 MG tablet Take 1 tablet (10 mg total) by mouth daily. 30 tablet 11   No current facility-administered medications on file prior to visit.   Active Ambulatory Problems    Diagnosis Date Noted  . Hypothyroidism 05/18/2006  . Hyperlipidemia 05/18/2006  .  DIVERTICULOSIS, COLON 05/18/2006  . RENAL CALCULUS 05/18/2006  . FIBROCYSTIC BREAST DISEASE 05/18/2006  . ROSACEA 05/18/2006  . DEGENERATIVE DISC DISEASE 05/18/2006  . BACK PAIN 09/06/2009  . Hyperglycemia 08/22/2009  . ALLERGY 05/18/2006  . NEPHROLITHIASIS, HX OF 09/06/2009  . POSTMENOPAUSAL STATUS 08/29/2008  . GERD (gastroesophageal reflux disease) 07/16/2010  . H/O abnormal Pap smear 05/06/2011  . Positive H. pylori test 03/26/2012  . Encounter for Medicare annual wellness exam 01/24/2013  . Colon cancer screening 03/06/2014  . Ganglion cyst of wrist 03/06/2014  . Facial cellulitis 12/03/2014    . Rash and nonspecific skin eruption 12/06/2014   Resolved Ambulatory Problems    Diagnosis Date Noted  . SHOULDER PAIN, LEFT 12/27/2009  . Insect bite 09/19/2010   Past Medical History  Diagnosis Date  . Diverticulosis   . Choroidal nevus   . Fibrocystic breast   . Allergy   . Nephrolithiasis   . Abnormal Pap smear   . DDD (degenerative disc disease), lumbar   . Cataract   . Esophagus, Barrett's    Social History  Substance Use Topics  . Smoking status: Never Smoker   . Smokeless tobacco: Never Used  . Alcohol Use: 0.0 oz/week    2-3 Glasses of wine per week     Comment: glass of wine every other night, beer once a month  family history includes Lung cancer in her father; Melanoma in her sister; Stroke in her mother. There is no history of Colon cancer or Stomach cancer.   Review of Systems  Constitutional: Negative for fever, chills, diaphoresis, activity change, appetite change, fatigue and unexpected weight change.  HENT: Negative for congestion, sore throat, rhinorrhea, sneezing, trouble swallowing and sinus pressure.  Eyes: Negative for photophobia and visual disturbance.  Respiratory: Negative for cough, chest tightness, shortness of breath, wheezing and stridor.  Cardiovascular: Negative for chest pain, palpitations and leg swelling.  Gastrointestinal: loose stool. Negative for nausea, vomiting, abdominal pain, diarrhea, constipation, blood in stool, abdominal distention and anal bleeding.  Genitourinary: Negative for dysuria, hematuria, flank pain and difficulty urinating.  Musculoskeletal: Negative for myalgias, back pain, joint swelling, arthralgias and gait problem.  Skin: rash per hpi. Negative for color change, pallor, rash and wound.  Neurological: Negative for dizziness, tremors, weakness and light-headedness.  Hematological: Negative for adenopathy. Does not bruise/bleed easily.  Psychiatric/Behavioral: Negative for behavioral problems, confusion, sleep  disturbance, dysphoric mood, decreased concentration and agitation.       Objective:   Physical Exam  HENT:  Head:      BP 131/75 mmHg  Pulse 81  Temp(Src) 98 F (36.7 C) (Oral)  Ht 5\' 4"  (1.626 m)  Wt 168 lb (76.204 kg)  BMI 28.82 kg/m2 Physical Exam  Constitutional:  oriented to person, place, and time. appears well-developed and well-nourished. No distress.  HENT: [place]/AT, PERRLA, no scleral icterus Mouth/Throat: Oropharynx is clear and moist. No oropharyngeal exudate.  Cardiovascular: Normal rate, regular rhythm and normal heart sounds. Exam reveals no gallop and no friction rub.  No murmur heard.  Pulmonary/Chest: Effort normal and breath sounds normal. No respiratory distress.  has no wheezes.  Neck = supple, no nuchal rigidity Abdominal: Soft. Bowel sounds are normal.  exhibits no distension. There is no tenderness.  Lymphadenopathy: no cervical adenopathy. No axillary adenopathy Neurological: alert and oriented to person, place, and time.  Skin: Skin is warm and dry. No rash noted. No erythema.  Psychiatric: a normal mood and affect.  behavior is normal.   Labs: Lab  Results  Component Value Date   WBC 5.0 12/04/2014   HGB 13.6 12/04/2014   HCT 40.4 12/04/2014   MCV 94.5 12/04/2014   PLT 199 12/04/2014   Imaging: I reviewed CT imaging that shows inflammation to right side of face     Assessment & Plan:  Facial cellulitis, likely from strep/staph = currently on day 8 of antibiotics, will give her additional tabs to finish nearly 14 d course. Continue with bid mupirocin til Friday  Hives = now resolved, will list doxycycline as a drug allergy on her list with SE of hives  Loose stools = likely antibiotic associated, does not appear to have diarrhea. Continue with probiotics  Health maintenance = will give flu shot

## 2014-12-27 ENCOUNTER — Encounter: Payer: Self-pay | Admitting: Family Medicine

## 2015-03-04 ENCOUNTER — Telehealth: Payer: Self-pay | Admitting: Family Medicine

## 2015-03-04 DIAGNOSIS — E785 Hyperlipidemia, unspecified: Secondary | ICD-10-CM

## 2015-03-04 DIAGNOSIS — R739 Hyperglycemia, unspecified: Secondary | ICD-10-CM

## 2015-03-04 DIAGNOSIS — Z0000 Encounter for general adult medical examination without abnormal findings: Secondary | ICD-10-CM

## 2015-03-04 DIAGNOSIS — E039 Hypothyroidism, unspecified: Secondary | ICD-10-CM

## 2015-03-04 NOTE — Telephone Encounter (Signed)
-----   Message from Marchia Bond sent at 02/27/2015  9:00 AM EST ----- Regarding: cpx labs Mon 1/9, need orders. Thanks! :-) Please order  future cpx labs for pt's upcoming lab appt. Thanks Aniceto Boss

## 2015-03-05 ENCOUNTER — Other Ambulatory Visit: Payer: Medicare PPO

## 2015-03-12 ENCOUNTER — Encounter: Payer: Medicare PPO | Admitting: Family Medicine

## 2015-03-26 ENCOUNTER — Other Ambulatory Visit: Payer: Self-pay | Admitting: Family Medicine

## 2015-03-29 ENCOUNTER — Encounter: Payer: Self-pay | Admitting: Internal Medicine

## 2015-03-29 ENCOUNTER — Ambulatory Visit (INDEPENDENT_AMBULATORY_CARE_PROVIDER_SITE_OTHER): Payer: Medicare PPO | Admitting: Internal Medicine

## 2015-03-29 VITALS — BP 128/74 | HR 72 | Temp 98.2°F | Wt 167.0 lb

## 2015-03-29 DIAGNOSIS — B349 Viral infection, unspecified: Secondary | ICD-10-CM

## 2015-03-29 DIAGNOSIS — J329 Chronic sinusitis, unspecified: Secondary | ICD-10-CM

## 2015-03-29 DIAGNOSIS — B9789 Other viral agents as the cause of diseases classified elsewhere: Secondary | ICD-10-CM

## 2015-03-29 MED ORDER — METHYLPREDNISOLONE ACETATE 80 MG/ML IJ SUSP
80.0000 mg | Freq: Once | INTRAMUSCULAR | Status: AC
  Administered 2015-03-29: 80 mg via INTRAMUSCULAR

## 2015-03-29 MED ORDER — AZITHROMYCIN 250 MG PO TABS
ORAL_TABLET | ORAL | Status: DC
Start: 2015-03-29 — End: 2015-04-09

## 2015-03-29 MED ORDER — HYDROCODONE-HOMATROPINE 5-1.5 MG/5ML PO SYRP: 5.0000 mL | ORAL_SOLUTION | Freq: Three times a day (TID) | ORAL | Status: DC | PRN

## 2015-03-29 NOTE — Progress Notes (Signed)
Pre visit review using our clinic review tool, if applicable. No additional management support is needed unless otherwise documented below in the visit note. 

## 2015-03-29 NOTE — Patient Instructions (Signed)

## 2015-03-29 NOTE — Addendum Note (Signed)
Addended by: Lurlean Nanny on: 03/29/2015 12:03 PM   Modules accepted: Orders

## 2015-03-29 NOTE — Progress Notes (Signed)
HPI  Pt presents to the clinic today with c/o productive cough with clear/yellow sputum for the past 3-4 days. She admits to bilateral ear fullness and nasal congestion with clear and green/blood tinged mucus.  She did have sinus symptoms 2 weeks ago, used neti pot with relief. She said it feels like the sinus pressure has returned. She has not tried OTC meds. She states that she is having trouble sleeping because of her cough. She had a history of sinus infections. She has a history of 2 chemical cauterizations and has not had sinus infections since the last one. She has a history of seasonal allergies and usually uses Flonase daily but she has not been using this since the fall. She denies fever, chills, body aches, chest pain or shortness of breath. She has not had sick contacts.  Review of Systems      Past Medical History  Diagnosis Date  . Hypothyroidism   . Diverticulosis   . Hyperlipidemia   . 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 Fibrocystic breast   . Allergy   . Nephrolithiasis     Hx of  . Abnormal Pap smear     ASCUS  . GERD (gastroesophageal reflux disease)   . Rosacea   . DDD (degenerative disc disease), lumbar   . Positive H. pylori test   . Ganglion cyst of wrist   . Cataract   . Esophagus, Barrett's     Family History  Problem Relation Age of Onset  . Lung cancer Father   . Melanoma Sister   . Stroke Mother   . Colon cancer Neg Hx   . Stomach cancer Neg Hx     Social History   Social History  . Marital Status: Married    Spouse Name: N/A  . Number of Children: N/A  . Years of Education: N/A   Occupational History  . Not on file.   Social History Main Topics  . Smoking status: Never Smoker   . Smokeless tobacco: Never Used  . Alcohol Use: 0.0 oz/week    2-3 Glasses of wine per week     Comment: glass of wine every other night, beer once a month  . Drug Use: No  . Sexual Activity: No   Other Topics Concern  . Not on file   Social History  Narrative    Allergies  Allergen Reactions  . Bee Venom Swelling  . Ciprofloxacin     REACTION: rash  . Doxycycline Hives    Non pruritic hives per patient description after 3 days of tx for facial cellulitis  . Latex     REACTION: rash  . Strawberry Extract Hives  . Sulfa Drugs Cross Reactors Diarrhea  . Sulfonamide Derivatives     REACTION: rash  . Adhesive [Tape] Rash  . Metronidazole Rash     Constitutional: Positive headache and fatigue. Denies abrupt weight changes.  HEENT:  Positive nasal congestion, runny nose, green/yellow/blood tinged mucus, facial pressure, and ear fullness. Denies eye redness, eye pain, or pressure behind the eyes. Respiratory: Positive mostly nonproductive cough with some clear/yellow sputum. Denies difficulty breathing or shortness of breath.  Cardiovascular: Denies chest pain or chest tightness.  No other specific complaints in a complete review of systems (except as listed in HPI above).  Objective:   BP 128/74 mmHg  Pulse 72  Temp(Src) 98.2 F (36.8 C) (Oral)  Wt 167 lb (75.751 kg)  SpO2 98%  Wt Readings from Last 3  Encounters:  03/29/15 167 lb (75.751 kg)  12/11/14 168 lb (76.204 kg)  12/06/14 166 lb 12 oz (75.637 kg)     General: Appears her stated age, ill appearing in NAD. HEENT: Head: normal shape and size, ethmoidal sinus tenderness noted; Eyes: sclera white, no icterus, conjunctiva pink; Ears: Tm's pink but intact with serous fluid behind TMs bilaterally, normal light reflex; Nose: mucosa pink and moist, septum midline; Throat/Mouth: +PND Teeth present, mucosa erythematous and moist, no exudate noted, no lesions or ulcerations noted.  Neck: No cervical lymphadenopathy.  Cardiovascular: Normal rate and rhythm. S1,S2 noted.  No murmur, rubs or gallops noted.  Pulmonary/Chest: Normal effort and positive vesicular breath sounds. No respiratory distress. No wheezes, rales or ronchi noted.      Assessment & Plan:   Viral  Sinusitis:  Get some rest and drink plenty of water Printed Rx for Azithromax x 5 days, instructed to fill prescription only if symptoms worsen and appear bacterial (bacterial infection symptoms discussed) Rx for Hycodan cough syrup Instructed to restart Flonase daily  Ok to continue use of neti pot  RTC as needed or if symptoms persist.

## 2015-04-05 ENCOUNTER — Other Ambulatory Visit (INDEPENDENT_AMBULATORY_CARE_PROVIDER_SITE_OTHER): Payer: Medicare PPO

## 2015-04-05 DIAGNOSIS — Z0000 Encounter for general adult medical examination without abnormal findings: Secondary | ICD-10-CM

## 2015-04-05 DIAGNOSIS — E785 Hyperlipidemia, unspecified: Secondary | ICD-10-CM

## 2015-04-05 DIAGNOSIS — R739 Hyperglycemia, unspecified: Secondary | ICD-10-CM | POA: Diagnosis not present

## 2015-04-05 DIAGNOSIS — E039 Hypothyroidism, unspecified: Secondary | ICD-10-CM

## 2015-04-05 LAB — COMPREHENSIVE METABOLIC PANEL
ALT: 41 U/L — AB (ref 0–35)
AST: 27 U/L (ref 0–37)
Albumin: 4.4 g/dL (ref 3.5–5.2)
Alkaline Phosphatase: 85 U/L (ref 39–117)
BILIRUBIN TOTAL: 0.3 mg/dL (ref 0.2–1.2)
BUN: 23 mg/dL (ref 6–23)
CALCIUM: 10.1 mg/dL (ref 8.4–10.5)
CO2: 32 meq/L (ref 19–32)
CREATININE: 0.86 mg/dL (ref 0.40–1.20)
Chloride: 100 mEq/L (ref 96–112)
GFR: 69.43 mL/min (ref >60.00)
Glucose, Bld: 111 mg/dL — ABNORMAL HIGH (ref 70–99)
Potassium: 5.1 mEq/L (ref 3.5–5.1)
Sodium: 138 mEq/L (ref 135–145)
Total Protein: 7 g/dL (ref 6.0–8.3)

## 2015-04-05 LAB — CBC WITH DIFFERENTIAL/PLATELET
BASOS ABS: 0.1 10*3/uL (ref 0.0–0.1)
Basophils Relative: 0.7 % (ref 0.0–3.0)
EOS ABS: 0.1 10*3/uL (ref 0.0–0.7)
Eosinophils Relative: 1.4 % (ref 0.0–5.0)
HEMATOCRIT: 45.4 % (ref 36.0–46.0)
HEMOGLOBIN: 15.4 g/dL — AB (ref 12.0–15.0)
LYMPHS PCT: 32.7 % (ref 12.0–46.0)
Lymphs Abs: 2.9 10*3/uL (ref 0.7–4.0)
MCHC: 34 g/dL (ref 30.0–36.0)
MCV: 94.8 fl (ref 78.0–100.0)
Monocytes Absolute: 0.6 10*3/uL (ref 0.1–1.0)
Monocytes Relative: 6.4 % (ref 3.0–12.0)
NEUTROS ABS: 5.2 10*3/uL (ref 1.4–7.7)
Neutrophils Relative %: 58.8 % (ref 43.0–77.0)
PLATELETS: 333 10*3/uL (ref 150.0–400.0)
RBC: 4.79 Mil/uL (ref 3.87–5.11)
RDW: 12.2 % (ref 11.5–15.5)
WBC: 8.9 10*3/uL (ref 4.0–10.5)

## 2015-04-05 LAB — LIPID PANEL
CHOL/HDL RATIO: 3
CHOLESTEROL: 220 mg/dL — AB (ref 0–200)
HDL: 63.1 mg/dL (ref >39.00)
LDL CALC: 134 mg/dL — AB (ref 0–99)
NONHDL: 157.38
Triglycerides: 115 mg/dL (ref 0.0–149.0)
VLDL: 23 mg/dL (ref 0.0–40.0)

## 2015-04-05 LAB — TSH: TSH: 0.72 u[IU]/mL (ref 0.35–4.50)

## 2015-04-05 LAB — HEMOGLOBIN A1C: Hgb A1c MFr Bld: 6 % (ref 4.6–6.5)

## 2015-04-09 ENCOUNTER — Encounter: Payer: Self-pay | Admitting: Family Medicine

## 2015-04-09 ENCOUNTER — Ambulatory Visit (INDEPENDENT_AMBULATORY_CARE_PROVIDER_SITE_OTHER): Payer: Medicare PPO | Admitting: Family Medicine

## 2015-04-09 VITALS — BP 132/78 | HR 64 | Temp 98.2°F | Ht 63.5 in | Wt 164.2 lb

## 2015-04-09 DIAGNOSIS — Z86000 Personal history of in-situ neoplasm of breast: Secondary | ICD-10-CM | POA: Diagnosis not present

## 2015-04-09 DIAGNOSIS — R739 Hyperglycemia, unspecified: Secondary | ICD-10-CM | POA: Diagnosis not present

## 2015-04-09 DIAGNOSIS — Z0000 Encounter for general adult medical examination without abnormal findings: Secondary | ICD-10-CM

## 2015-04-09 DIAGNOSIS — E785 Hyperlipidemia, unspecified: Secondary | ICD-10-CM

## 2015-04-09 MED ORDER — SIMVASTATIN 10 MG PO TABS: 10.0000 mg | ORAL_TABLET | Freq: Every day | ORAL | Status: AC

## 2015-04-09 MED ORDER — OMEPRAZOLE 40 MG PO CPDR: 40.0000 mg | DELAYED_RELEASE_CAPSULE | Freq: Every day | ORAL | Status: AC

## 2015-04-09 NOTE — Assessment & Plan Note (Signed)
Disc goals for lipids and reasons to control them Rev labs with pt Rev low sat fat diet in detail Continue zocor and diet  

## 2015-04-09 NOTE — Progress Notes (Signed)
Pre visit review using our clinic review tool, if applicable. No additional management support is needed unless otherwise documented below in the visit note. 

## 2015-04-09 NOTE — Assessment & Plan Note (Signed)
Reviewed health habits including diet and exercise and skin cancer prevention Reviewed appropriate screening tests for age  Also reviewed health mt list, fam hx and immunization status , as well as social and family history   See HPI Labs reviewed Please work on an advance directive - see the blue booklet Blood sugar is a bit elevated- we want to watch this Weight loss and lower sugar/carb diet will help as will exercise  Take care of yourself Continue calcium and vitamin D for bone health

## 2015-04-09 NOTE — Progress Notes (Signed)
Subjective:    Patient ID: Nancy [name], female    DOB: 1945/06/22, 70 y.o.   MRN: [id number]  HPI Here for annual medicare wellness visit as well as chronic/acute medical problems as well as routine preventative exam  I have personally reviewed the Medicare Annual Wellness questionnaire and have noted 1. The patient's medical and social history 2. Their use of alcohol, tobacco or illicit drugs 3. Their current medications and supplements 4. The patient's functional ability including ADL's, fall risks, home safety risks and hearing or visual             impairment. 5. Diet and physical activities 6. Evidence for depression or mood disorders  The patients weight, height, BMI have been recorded in the chart and visual acuity is per eye clinic.  I have made referrals, counseling and provided education to the patient based review of the above and I have provided the pt with a written personalized care plan for preventive services. Reviewed and updated provider list, see scanned forms.  See scanned forms.  Routine anticipatory guidance given to patient.  See health maintenance. Colon cancer screening  Breast cancer screening- breast follow up - had surgery by Dr Excell Seltzer- had a lump she had had a long time (found a different problem on MRI) - ended up with a partial mastectomy - no chemo or radiation  Self breast exam- still feels the original lump and ropey tissue with fibrocystic breasts  Flu vaccine 10/16  (from ID doctor for the spider bite she had)  Tetanus vaccine 1/11  Pneumovax 1/16- complete on both of them  Zoster vaccine 7/09 dexa 5/05 --normal wants to wait one more year given all of her doctor appt and proceedures No falls or fractures Taking her ca and D Advance directive ?unsure if she has living will and POA Cognitive function addressed- see scanned forms- and if abnormal then additional documentation follows. Memory is pretty good overall   PMH and SH  reviewed  Meds, vitals, and allergies reviewed.   ROS: See HPI.  Otherwise negative.    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  Needs cataract surgery - both   Sees endocrinology for thyroid Lab Results  Component Value Date   TSH 0.72 04/05/2015   here- no clinical change     Chemistry      Component Value Date/Time   NA 138 04/05/2015 0908   NA 140 01/21/2012 1344   K 5.1 04/05/2015 0908   K 3.9 01/21/2012 1344   CL 100 04/05/2015 0908   CL 106 01/21/2012 1344   CO2 32 04/05/2015 0908   CO2 28 01/21/2012 1344   BUN 23 04/05/2015 0908   BUN 12 01/21/2012 1344   CREATININE 0.86 04/05/2015 0908   CREATININE 0.66 01/21/2012 1344      Component Value Date/Time   CALCIUM 10.1 04/05/2015 0908   CALCIUM 9.1 01/21/2012 1344   ALKPHOS 85 04/05/2015 0908   ALKPHOS 77 01/21/2012 1344   AST 27 04/05/2015 0908   AST 33 01/21/2012 1344   ALT 41* 04/05/2015 0908   ALT 43 01/21/2012 1344   BILITOT 0.3 04/05/2015 0908   BILITOT 0.4 01/21/2012 1344     ALT is improved from previous   Had breast lumpectomy-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-- plans to get MRI with contrast every 2 years and mammogram in between   fam hx of melanoma   No change in cbc Lab Results  Component Value Date   WBC 8.9 04/05/2015   HGB 15.4* 04/05/2015   HCT 45.4 04/05/2015   MCV 94.8 04/05/2015   PLT 333.0 04/05/2015   is interested in donating blood   Glucose was 111 Lab Results  Component Value Date   HGBA1C 6.0 04/05/2015   this is up from 5.6  Getting back to normal with diet now   Cholesterol Lab Results  Component Value Date   CHOL 220* 04/05/2015   CHOL 217* 08/22/2014   CHOL 228* 03/06/2014   Lab Results  Component Value Date   HDL 63.10 04/05/2015   HDL 50.10 08/22/2014   HDL 51.60 03/06/2014   Lab Results  Component Value Date   LDLCALC 134* 04/05/2015   LDLCALC 131* 08/22/2014   LDLCALC 137* 03/06/2014   Lab  Results  Component Value Date   TRIG 115.0 04/05/2015   TRIG 181.0* 08/22/2014   TRIG 195.0* 03/06/2014   Lab Results  Component Value Date   CHOLHDL 3 04/05/2015   CHOLHDL 4 08/22/2014   CHOLHDL 4 03/06/2014   Lab Results  Component Value Date   LDLDIRECT 162.3 11/20/2008   on zocor 10 mg and diet   Patient Active Problem List   Diagnosis Date Noted  . History of carcinoma in situ of breast 04/09/2015  . Routine general medical examination at a health care facility 03/04/2015  . Rash and nonspecific skin eruption 12/06/2014  . Facial cellulitis 12/03/2014  . Colon cancer screening 03/06/2014  . Ganglion cyst of wrist 03/06/2014  . Encounter for Medicare annual wellness exam 01/24/2013  . Positive H. pylori test 03/26/2012  . H/O abnormal Pap smear 05/06/2011  . GERD (gastroesophageal reflux disease) 07/16/2010  . BACK PAIN 09/06/2009  . NEPHROLITHIASIS, HX OF 09/06/2009  . Hyperglycemia 08/22/2009  . POSTMENOPAUSAL STATUS 08/29/2008  . Hypothyroidism 05/18/2006  . Hyperlipidemia 05/18/2006  . DIVERTICULOSIS, COLON 05/18/2006  . RENAL CALCULUS 05/18/2006  . FIBROCYSTIC BREAST DISEASE 05/18/2006  . ROSACEA 05/18/2006  . DEGENERATIVE DISC DISEASE 05/18/2006  . ALLERGY 05/18/2006   Past Medical History  Diagnosis Date  . Hypothyroidism   . Diverticulosis   . Hyperlipidemia   . 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 Fibrocystic breast   . Allergy   . Nephrolithiasis     Hx of  . Abnormal Pap smear     ASCUS  . GERD (gastroesophageal reflux disease)   . Rosacea   . DDD (degenerative disc disease), lumbar   . Positive H. pylori test   . Ganglion cyst of wrist   . Cataract   . Esophagus, Barrett's    Past Surgical History  Procedure Laterality Date  . Tonsillectomy  1968  . Thyroidectomy  1972  . Abdominal hysterectomy  2005  . Bunionectomy  2010  . Laparoscopy      D&C  . Dilation and curettage of uterus    . Tubal ligation    . Breast cyst aspiration   06/2003    and 04/2004  . Colonoscopy  2006  . Bilateral salpingoophorectomy  2005  . Metatarsal osteotomy with bunionectomy Bilateral 2011  . Right breast lumpectomy    . Left breast lumpectomy    . Breast lumpectomy with radioactive seed localization Left 09/05/2014    Procedure: LEFT BREAST LUMPECTOMY WITH RADIOACTIVE SEED LOCALIZATION;  Surgeon: Excell Seltzer, MD;  Location: [facility];  Service: General;  Laterality: Left;   Social History  Substance Use Topics  . Smoking  status: Never Smoker   . Smokeless tobacco: Never Used  . Alcohol Use: 0.0 oz/week    2-3 Glasses of wine per week     Comment: glass of wine every other night, beer once a month   Family History  Problem Relation Age of Onset  . Lung cancer Father   . Melanoma Sister   . Stroke Mother   . Colon cancer Neg Hx   . Stomach cancer Neg Hx    Allergies  Allergen Reactions  . Bee Venom Swelling  . Ciprofloxacin     REACTION: rash  . Doxycycline Hives    Non pruritic hives per patient description after 3 days of tx for facial cellulitis  . Latex     REACTION: rash  . Strawberry Extract Hives  . Sulfa Drugs Cross Reactors Diarrhea  . Sulfonamide Derivatives     REACTION: rash  . Adhesive [Tape] Rash  . Metronidazole Rash   Current Outpatient Prescriptions on File Prior to Visit  Medication Sig Dispense Refill  . aspirin EC 81 MG tablet Take 81 mg by mouth daily.    . calcium-vitamin D (OSCAL WITH D) 500-200 MG-UNIT per tablet Take 1 tablet by mouth daily.    . fluticasone (FLONASE) 50 MCG/ACT nasal spray Place 1 spray into the nose daily as needed for allergies or rhinitis. Reported on 03/29/2015    . Ketotifen Fumarate (ZADITOR OP) Apply 1 drop to eye daily as needed (for allergies.).     Marland Kitchen levothyroxine (SYNTHROID, LEVOTHROID) 88 MCG tablet Take 88 mcg by mouth daily. Patients takes brand name.    . Multiple Vitamins-Minerals (CENTRUM SILVER ULTRA WOMENS PO) Take 1 tablet by mouth  daily.    . Omega-3 Fatty Acids (FISH OIL) 500 MG CAPS Take 1 capsule by mouth daily.    Vladimir Faster Glycol-Propyl Glycol (SYSTANE) 0.4-0.3 % SOLN Apply 1 drop to eye daily as needed (for allergies.).     Marland Kitchen Probiotic Product (PROBIOTIC DAILY PO) Take 1 capsule by mouth daily.      No current facility-administered medications on file prior to visit.    Review of Systems Review of Systems  Constitutional: Negative for fever, appetite change, fatigue and unexpected weight change.  Eyes: Negative for pain and visual disturbance.  Respiratory: Negative for cough and shortness of breath.   Cardiovascular: Negative for cp or palpitations    Gastrointestinal: Negative for nausea, diarrhea and constipation.  Genitourinary: Negative for urgency and frequency.  Skin: Negative for pallor or rash   Neurological: Negative for weakness, light-headedness, numbness and headaches.  Hematological: Negative for adenopathy. Does not bruise/bleed easily.  Psychiatric/Behavioral: Negative for dysphoric mood. The patient is not nervous/anxious.         Objective:   Physical Exam  Constitutional: She appears well-developed and well-nourished. No distress.  overwt and well appearing   HENT:  Head: Normocephalic and atraumatic.  Right Ear: External ear normal.  Left Ear: External ear normal.  Mouth/Throat: Oropharynx is clear and moist.  Eyes: Conjunctivae and EOM are normal. Pupils are equal, round, and reactive to light. No scleral icterus.  Neck: Normal range of motion. Neck supple. No JVD present. Carotid bruit is not present. No thyromegaly present.  Cardiovascular: Normal rate, regular rhythm, normal heart sounds and intact distal pulses.  Exam reveals no gallop.   Pulmonary/Chest: Effort normal and breath sounds normal. No respiratory distress. She has no wheezes. She exhibits no tenderness.  Abdominal: Soft. Bowel sounds are normal. She exhibits no distension,  no abdominal bruit and no mass. There is  no tenderness.  Genitourinary: No breast swelling, tenderness, discharge or bleeding.  Scar on L lateral  breast with scar tissue underlying-nt   No other breast findings  No skin change or nipple d/c   Musculoskeletal: Normal range of motion. She exhibits no edema or tenderness.  Lymphadenopathy:    She has no cervical adenopathy.  Neurological: She is alert. She has normal reflexes. No cranial nerve deficit. She exhibits normal muscle tone. Coordination normal.  Skin: Skin is warm and dry. No rash noted. No erythema. No pallor.  Psychiatric: She has a normal mood and affect.          Assessment & Plan:   Problem List Items Addressed This Visit      Other   Encounter for Medicare annual wellness exam    Reviewed health habits including diet and exercise and skin cancer prevention Reviewed appropriate screening tests for age  Also reviewed health mt list, fam hx and immunization status , as well as social and family history   See HPI Labs reviewed Please work on an advance directive - see the blue booklet Blood sugar is a bit elevated- we want to watch this Weight loss and lower sugar/carb diet will help as will exercise  Take care of yourself Continue calcium and vitamin D for bone health      History of carcinoma in situ of breast    Pt doing well overall  Will continue f/u with surgeon and every other year MRI /yearly mammograms       Hyperglycemia    Lab Results  Component Value Date   HGBA1C 6.0 04/05/2015   Disc imp of wt control and low glycemic diet to prevent DM2      Hyperlipidemia - Primary    Disc goals for lipids and reasons to control them Rev labs with pt Rev low sat fat diet in detail  Continue zocor and diet       Relevant Medications   simvastatin (ZOCOR) 10 MG tablet   Routine general medical examination at a health care facility    Reviewed health habits including diet and exercise and skin cancer prevention Reviewed appropriate  screening tests for age  Also reviewed health mt list, fam hx and immunization status , as well as social and family history   See HPI Labs reviewed Please work on an advance directive - see the blue booklet Blood sugar is a bit elevated- we want to watch this Weight loss and lower sugar/carb diet will help as will exercise  Take care of yourself

## 2015-04-09 NOTE — Assessment & Plan Note (Signed)
Reviewed health habits including diet and exercise and skin cancer prevention Reviewed appropriate screening tests for age  Also reviewed health mt list, fam hx and immunization status , as well as social and family history   See HPI Labs reviewed Please work on an advance directive - see the blue booklet Blood sugar is a bit elevated- we want to watch this Weight loss and lower sugar/carb diet will help as will exercise  Take care of yourself

## 2015-04-09 NOTE — Assessment & Plan Note (Signed)
Pt doing well overall  Will continue f/u with surgeon and every other year MRI /yearly mammograms

## 2015-04-09 NOTE — Assessment & Plan Note (Signed)
Lab Results  Component Value Date   HGBA1C 6.0 04/05/2015   Disc imp of wt control and low glycemic diet to prevent DM2

## 2015-04-09 NOTE — Patient Instructions (Signed)
Please work on an advance directive - see the blue booklet Blood sugar is a bit elevated- we want to watch this Weight loss and lower sugar/carb diet will help as will exercise  Take care of yourself Continue calcium and vitamin D for bone health

## 2015-05-21 ENCOUNTER — Other Ambulatory Visit: Payer: Self-pay

## 2015-05-21 ENCOUNTER — Other Ambulatory Visit: Payer: Self-pay | Admitting: General Surgery

## 2015-05-21 DIAGNOSIS — R928 Other abnormal and inconclusive findings on diagnostic imaging of breast: Secondary | ICD-10-CM

## 2015-05-29 ENCOUNTER — Ambulatory Visit
Admission: RE | Admit: 2015-05-29 | Discharge: 2015-05-29 | Disposition: A | Discharge status: Home / Self Care | Payer: Medicare PPO | Source: Ambulatory Visit | Attending: General Surgery | Admitting: General Surgery

## 2015-05-29 DIAGNOSIS — R928 Other abnormal and inconclusive findings on diagnostic imaging of breast: Secondary | ICD-10-CM

## 2015-05-29 LAB — HM MAMMOGRAPHY: HM Mammogram: NORMAL (ref 0–4)

## 2015-05-30 ENCOUNTER — Encounter: Payer: Self-pay | Admitting: *Deleted

## 2015-06-14 ENCOUNTER — Ambulatory Visit (INDEPENDENT_AMBULATORY_CARE_PROVIDER_SITE_OTHER): Payer: Medicare PPO | Admitting: Family Medicine

## 2015-06-14 ENCOUNTER — Encounter: Payer: Self-pay | Admitting: Family Medicine

## 2015-06-14 VITALS — BP 102/60 | HR 69 | Temp 97.9°F | Ht 63.5 in | Wt 170.4 lb

## 2015-06-14 DIAGNOSIS — M25422 Effusion, left elbow: Secondary | ICD-10-CM | POA: Insufficient documentation

## 2015-06-14 DIAGNOSIS — M545 Low back pain, unspecified: Secondary | ICD-10-CM

## 2015-06-14 MED ORDER — DICLOFENAC SODIUM 75 MG PO TBEC: 75.0000 mg | DELAYED_RELEASE_TABLET | Freq: Two times a day (BID) | ORAL | Status: DC

## 2015-06-14 NOTE — Progress Notes (Signed)
Subjective:    Patient ID: Nancy [name], female    DOB: [birth year], 70 y.o.   MRN: [id number]  HPI  70 year old female pt of Dr. Marliss Coots  presents with new onset pain in back, acute flare in 1.5 week. Pain is in left side lumbar spine. Increase in pain going sitting to standing. No radiation of pain, no weakness or numbness in left leg.  No fever, no incontinence. She has history of chronic intermittant back pain. 2005   She has been using heating pad. Doing floor stretches... In past usuall goes away but this time it has not.  She has tried aleve 1 twice daily without relief.   She has also noted swelling in left medial elbow, mild pain. She has been treating with icing. Decreased in swelling. No redness.  Not sure how long going on, daughter noted 1 week ago. No repetitive action with left arm.   Social History /Family History/Past Medical History reviewed and updated if needed.   Review of Systems  Constitutional: Negative for fever and fatigue.  HENT: Negative for ear pain.   Eyes: Negative for pain.  Respiratory: Negative for chest tightness and shortness of breath.   Cardiovascular: Negative for chest pain, palpitations and leg swelling.  Gastrointestinal: Negative for abdominal pain.  Genitourinary: Negative for dysuria.       Objective:   Physical Exam  Constitutional: Vital signs are normal. She appears well-developed and well-nourished. She is cooperative.  Non-toxic appearance. She does not appear ill. No distress.  HENT:  Head: Normocephalic.  Right Ear: Hearing, tympanic membrane, external ear and ear canal normal. Tympanic membrane is not erythematous, not retracted and not bulging.  Left Ear: Hearing, tympanic membrane, external ear and ear canal normal. Tympanic membrane is not erythematous, not retracted and not bulging.  Nose: No mucosal edema or rhinorrhea. Right sinus exhibits no maxillary sinus tenderness and no frontal sinus tenderness. Left sinus  exhibits no maxillary sinus tenderness and no frontal sinus tenderness.  Mouth/Throat: Uvula is midline, oropharynx is clear and moist and mucous membranes are normal.  Eyes: Conjunctivae, EOM and lids are normal. Pupils are equal, round, and reactive to light. Lids are everted and swept, no foreign bodies found.  Neck: Trachea normal and normal range of motion. Neck supple. Carotid bruit is not present. No thyroid mass and no thyromegaly present.  Cardiovascular: Normal rate, regular rhythm, S1 normal, S2 normal, normal heart sounds, intact distal pulses and normal pulses.  Exam reveals no gallop and no friction rub.   No murmur heard. Pulmonary/Chest: Effort normal and breath sounds normal. No tachypnea. No respiratory distress. She has no decreased breath sounds. She has no wheezes. She has no rhonchi. She has no rales.  Abdominal: Soft. Normal appearance and bowel sounds are normal. There is no tenderness.  Musculoskeletal:       Left elbow: She exhibits swelling. She exhibits normal range of motion, no effusion, no deformity and no laceration. No tenderness found. No radial head, no medial epicondyle, no lateral epicondyle and no olecranon process tenderness noted.       Lumbar back: She exhibits decreased range of motion and tenderness. She exhibits no bony tenderness.  ttp in left low back and left buttock, neg SLR bilaterally  neg faber's  Swelling inleft anterior lateral elbow.. Not over epicondyle  Neurological: She is alert.  Skin: Skin is warm, dry and intact. No rash noted.  Psychiatric: Her speech is normal and behavior is normal. Judgment  and thought content normal. Her mood appears not anxious. Cognition and memory are normal. She does not exhibit a depressed mood.          Assessment & Plan:

## 2015-06-14 NOTE — Assessment & Plan Note (Signed)
Treat with NSAID ( diclofenac), ice/heat, stretching. If not improving further eval in 2 weeks.

## 2015-06-14 NOTE — Patient Instructions (Addendum)
Continue ice or heat and continue home stretching.  Start diclofenac twice daily x 2 weeks. Stop aleve.  If not improving call for further evaluation. Follow left elbow swelling, ice, elevate, call if not continuing to improve.

## 2015-06-14 NOTE — Progress Notes (Signed)
Pre visit review using our clinic review tool, if applicable. No additional management support is needed unless otherwise documented below in the visit note. 

## 2015-06-14 NOTE — Assessment & Plan Note (Signed)
More superficial than in joint, not clearly associated with the medial epicondyle.? Contact, insect bite, allergic reaction. Treat with ice , elevation and NSAIDs. Follow up if not improving as expected.

## 2015-06-18 ENCOUNTER — Ambulatory Visit (INDEPENDENT_AMBULATORY_CARE_PROVIDER_SITE_OTHER): Payer: Medicare PPO | Admitting: Family Medicine

## 2015-06-18 ENCOUNTER — Ambulatory Visit (INDEPENDENT_AMBULATORY_CARE_PROVIDER_SITE_OTHER)
Admission: RE | Admit: 2015-06-18 | Discharge: 2015-06-18 | Disposition: A | Discharge status: Home / Self Care | Payer: Medicare PPO | Source: Ambulatory Visit | Attending: Family Medicine | Admitting: Family Medicine

## 2015-06-18 ENCOUNTER — Encounter: Payer: Self-pay | Admitting: Family Medicine

## 2015-06-18 VITALS — BP 128/68 | HR 71 | Temp 97.7°F | Ht 63.5 in | Wt 172.5 lb

## 2015-06-18 DIAGNOSIS — M545 Low back pain, unspecified: Secondary | ICD-10-CM

## 2015-06-18 DIAGNOSIS — D1720 Benign lipomatous neoplasm of skin and subcutaneous tissue of unspecified limb: Secondary | ICD-10-CM | POA: Diagnosis not present

## 2015-06-18 LAB — POC URINALSYSI DIPSTICK (AUTOMATED)
Bilirubin, UA: NEGATIVE
Glucose, UA: NEGATIVE
KETONES UA: NEGATIVE
Nitrite, UA: NEGATIVE
PH UA: 6
PROTEIN UA: NEGATIVE
RBC UA: NEGATIVE
SPEC GRAV UA: 1.025
UROBILINOGEN UA: 0.2

## 2015-06-18 MED ORDER — CYCLOBENZAPRINE HCL 10 MG PO TABS: 10.0000 mg | ORAL_TABLET | Freq: Three times a day (TID) | ORAL | Status: AC | PRN

## 2015-06-18 NOTE — Progress Notes (Signed)
Pre visit review using our clinic review tool, if applicable. No additional management support is needed unless otherwise documented below in the visit note. 

## 2015-06-18 NOTE — Progress Notes (Signed)
Subjective:    Patient ID: Nancy [name], female    DOB: May 22, 1945, 70 y.o.   MRN: [id number]  HPI Here for back pain   Going on a few weeks   Last Thursday saw Dr Diona Browner  Put her on diclofenac  Did not help and last night - she had some gerd with it and a little stomach ache on sat   L lumbar  Worse with changing positions Hard to stretch - getting up and down  Heat helps temporarily  Still no radiation   Also had a puffy area on L arm -antecubital area - and that is down quite a bit  Does not hurt or bother her at all    No urinary symptoms Results for orders placed or performed in visit on 06/18/15  POCT Urinalysis Dipstick (Automated)  Result Value Ref Range   Color, UA Yellow    Clarity, UA Clear    Glucose, UA Neg.    Bilirubin, UA Neg.    Ketones, UA Neg.    Spec Grav, UA 1.025    Blood, UA Neg.    pH, UA 6.0    Protein, UA Neg.    Urobilinogen, UA 0.2    Nitrite, UA Neg.    Leukocytes, UA small (1+) (A) Negative    Patient Active Problem List   Diagnosis Date Noted  . Lipoma of arm 06/18/2015  . Swelling of joint, elbow, left 06/14/2015  . History of carcinoma in situ of breast 04/09/2015  . Routine general medical examination at a health care facility 03/04/2015  . Rash and nonspecific skin eruption 12/06/2014  . Facial cellulitis 12/03/2014  . Colon cancer screening 03/06/2014  . Ganglion cyst of wrist 03/06/2014  . Encounter for Medicare annual wellness exam 01/24/2013  . Positive H. pylori test 03/26/2012  . H/O abnormal Pap smear 05/06/2011  . GERD (gastroesophageal reflux disease) 07/16/2010  . Low back pain without sciatica 09/06/2009  . NEPHROLITHIASIS, HX OF 09/06/2009  . Hyperglycemia 08/22/2009  . POSTMENOPAUSAL STATUS 08/29/2008  . Hypothyroidism 05/18/2006  . Hyperlipidemia 05/18/2006  . DIVERTICULOSIS, COLON 05/18/2006  . RENAL CALCULUS 05/18/2006  . FIBROCYSTIC BREAST DISEASE 05/18/2006  . ROSACEA 05/18/2006  . DEGENERATIVE  DISC DISEASE 05/18/2006  . ALLERGY 05/18/2006   Past Medical History  Diagnosis Date  . Hypothyroidism   . Diverticulosis   . Hyperlipidemia   . 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 Fibrocystic breast   . Allergy   . Nephrolithiasis     Hx of  . Abnormal Pap smear     ASCUS  . GERD (gastroesophageal reflux disease)   . Rosacea   . DDD (degenerative disc disease), lumbar   . Positive H. pylori test   . Ganglion cyst of wrist   . Cataract   . Esophagus, Barrett's    Past Surgical History  Procedure Laterality Date  . Tonsillectomy  1968  . Thyroidectomy  1972  . Abdominal hysterectomy  2005  . Bunionectomy  2010  . Laparoscopy      D&C  . Dilation and curettage of uterus    . Tubal ligation    . Breast cyst aspiration  06/2003    and 04/2004  . Colonoscopy  2006  . Bilateral salpingoophorectomy  2005  . Metatarsal osteotomy with bunionectomy Bilateral 2011  . Right breast lumpectomy    . Left breast lumpectomy    . Breast lumpectomy with radioactive seed localization Left 09/05/2014  Procedure: LEFT BREAST LUMPECTOMY WITH RADIOACTIVE SEED LOCALIZATION;  Surgeon: Excell Seltzer, MD;  Location: [facility];  Service: General;  Laterality: Left;   Social History  Substance Use Topics  . Smoking status: Never Smoker   . Smokeless tobacco: Never Used  . Alcohol Use: 0.0 oz/week    2-3 Glasses of wine per week     Comment: glass of wine every other night, beer once a month   Family History  Problem Relation Age of Onset  . Lung cancer Father   . Melanoma Sister   . Stroke Mother   . Colon cancer Neg Hx   . Stomach cancer Neg Hx    Allergies  Allergen Reactions  . Bee Venom Swelling  . Ciprofloxacin     REACTION: rash  . Doxycycline Hives    Non pruritic hives per patient description after 3 days of tx for facial cellulitis  . Latex     REACTION: rash  . Strawberry Extract Hives  . Sulfa Drugs Cross Reactors Diarrhea  . Sulfonamide  Derivatives     REACTION: rash  . Adhesive [Tape] Rash  . Metronidazole Rash   Current Outpatient Prescriptions on File Prior to Visit  Medication Sig Dispense Refill  . aspirin EC 81 MG tablet Take 81 mg by mouth daily.    . calcium-vitamin D (OSCAL WITH D) 500-200 MG-UNIT per tablet Take 1 tablet by mouth daily.    . fluticasone (FLONASE) 50 MCG/ACT nasal spray Place 1 spray into the nose daily as needed for allergies or rhinitis. Reported on 03/29/2015    . Ketotifen Fumarate (ZADITOR OP) Apply 1 drop to eye daily as needed (for allergies.).     Marland Kitchen levothyroxine (SYNTHROID, LEVOTHROID) 88 MCG tablet Take 88 mcg by mouth daily. Patients takes brand name.    . Multiple Vitamins-Minerals (CENTRUM SILVER ULTRA WOMENS PO) Take 1 tablet by mouth daily.    . Omega-3 Fatty Acids (FISH OIL) 500 MG CAPS Take 1 capsule by mouth daily.    Marland Kitchen omeprazole (PRILOSEC) 40 MG capsule Take 1 capsule (40 mg total) by mouth daily. 30 capsule 11  . Polyethyl Glycol-Propyl Glycol (SYSTANE) 0.4-0.3 % SOLN Apply 1 drop to eye daily as needed (for allergies.).     Marland Kitchen Probiotic Product (PROBIOTIC DAILY PO) Take 1 capsule by mouth daily.     . simvastatin (ZOCOR) 10 MG tablet Take 1 tablet (10 mg total) by mouth daily. 30 tablet 11   No current facility-administered medications on file prior to visit.    Review of Systems Review of Systems  Constitutional: Negative for fever, appetite change, fatigue and unexpected weight change.  Eyes: Negative for pain and visual disturbance.  Respiratory: Negative for cough and shortness of breath.   Cardiovascular: Negative for cp or palpitations    Gastrointestinal: Negative for nausea, diarrhea and constipation.  Genitourinary: Negative for urgency and frequency.  MSK  Pos for low back pain  Skin: Negative for pallor or rash   Neurological: Negative for weakness, light-headedness, numbness and headaches.  Hematological: Negative for adenopathy. Does not bruise/bleed easily.    Psychiatric/Behavioral: Negative for dysphoric mood. The patient is not nervous/anxious.         Objective:   Physical Exam  Constitutional: She appears well-developed and well-nourished. No distress.  obese and well appearing   HENT:  Head: Normocephalic and atraumatic.  Eyes: Conjunctivae and EOM are normal. Pupils are equal, round, and reactive to light. No scleral icterus.  Neck: Normal  range of motion. Neck supple.  Cardiovascular: Normal rate, regular rhythm and intact distal pulses.   Pulmonary/Chest: Effort normal and breath sounds normal. She has no wheezes. She has no rales.  Abdominal: Soft. Bowel sounds are normal. She exhibits no distension. There is no tenderness.  Musculoskeletal: She exhibits tenderness.       Lumbar back: She exhibits decreased range of motion, tenderness and spasm. She exhibits no bony tenderness and no edema.  Tenderness L of spine and small amt in piriformis  Nl rom of hips Pain on L lat bend and 30 deg flex  Loss of lordosis noted  No scoliosis   Lymphadenopathy:    She has no cervical adenopathy.  Neurological: She is alert. She has normal strength and normal reflexes. She displays no atrophy. No cranial nerve deficit or sensory deficit. She exhibits normal muscle tone. Coordination normal.  Negative SLR  Skin: Skin is warm and dry. No rash noted. No erythema. No pallor.  Fatty lump in L antecubital area-nt and no skin discoloration   Psychiatric: She has a normal mood and affect.          Assessment & Plan:   Problem List Items Addressed This Visit      Other   Lipoma of arm    Left antecubital area No symptoms  Will watch this  Soft and fatty in texture      Low back pain without sciatica - Primary    L sided  Little imp with nsaid (and GI distress) Enc to continue heat  Handout on back stretches given  LS xray in light of length of symptoms today Trial of flexeril (when not working or driving)  Plan from there        Relevant Medications   cyclobenzaprine (FLEXERIL) 10 MG tablet   Other Relevant Orders   DG Lumbar Spine Complete (Completed)    Other Visit Diagnoses    Low back pain, unspecified back pain laterality, with sciatica presence unspecified        Relevant Medications    cyclobenzaprine (FLEXERIL) 10 MG tablet    Other Relevant Orders    POCT Urinalysis Dipstick (Automated) (Completed)

## 2015-06-18 NOTE — Assessment & Plan Note (Signed)
L sided  Little imp with nsaid (and GI distress) Enc to continue heat  Handout on back stretches given  LS xray in light of length of symptoms today Trial of flexeril (when not working or driving)  Plan from there

## 2015-06-18 NOTE — Patient Instructions (Signed)
Xray today of low back  Continue heat on and off  Try flexeril for spasm -watch out for sedation Stop diclofenac and avoid other nsaids like aleve  I think you may have a lipoma on your arm - we will watch this - and alert me if any changes

## 2015-06-18 NOTE — Assessment & Plan Note (Signed)
Left antecubital area No symptoms  Will watch this  Soft and fatty in texture

## 2015-06-19 ENCOUNTER — Telehealth: Payer: Self-pay | Admitting: Family Medicine

## 2015-06-19 NOTE — Telephone Encounter (Signed)
Addressed through result notes  

## 2015-06-19 NOTE — Telephone Encounter (Signed)
Patient returned Shapale's call. °

## 2015-12-11 LAB — HM DIABETES EYE EXAM

## 2016-03-17 ENCOUNTER — Encounter: Payer: Self-pay | Admitting: Gastroenterology

## 2017-02-26 IMAGING — MR MR BREAST BILAT WO/W CM
8 of 13 series · 30 of 48 positions shown · IV contrast (15ml Multihance)
Comparison: Bilateral breast MRI 02/19/2009.

CLINICAL DATA: 68-year-old with heterogeneously dense breast tissue
on mammography (ACR Breast density category C), with recurrent
palpable lumps in the upper inner left breast with no sonographic
correlate.

LABS:  BUN and creatinine were obtained on site at [HOSPITAL]
[HOSPITAL] [HOSPITAL].
Results:  BUN 17 mg/dL, Creatinine 0.9 mg/dL, estimated GFR 63.
EXAM:
BILATERAL BREAST MRI WITH AND WITHOUT CONTRAST
TECHNIQUE: Multiplanar, multisequence MR images of both breasts were obtained
prior to and following the intravenous administration of 15 ml of
MultiHance.

[Series 2: T2 · axial · 3.0mm · 0.94mm/px · z∈[-86,+91]mm · 3 of 60 slices shown]
[im 1/60]
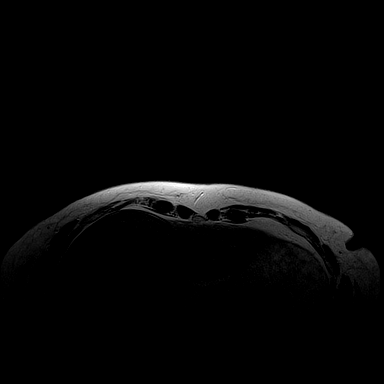
[im 30/60]
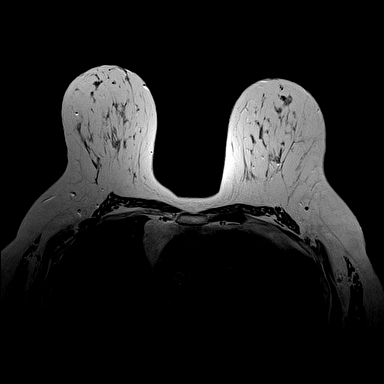
[im 60/60]
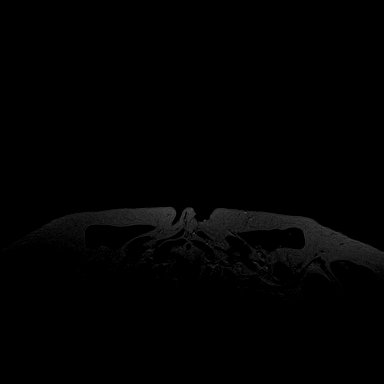

[Series 3: t2_tirm_tra ipat (a-p) · axial · 3.0mm · 0.70mm/px · z∈[-86,+91]mm · 2 of 60 slices shown]
[im 1/60]
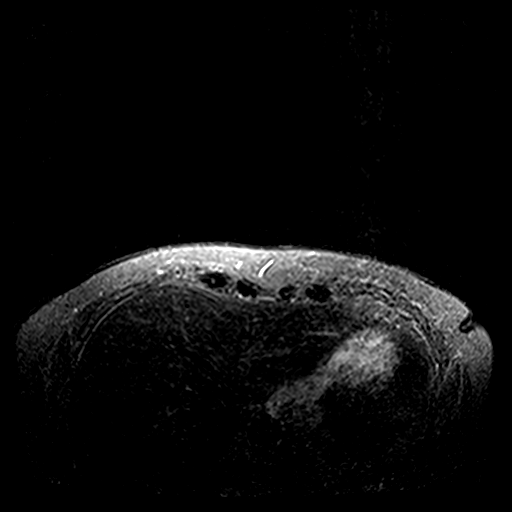
[im 60/60]
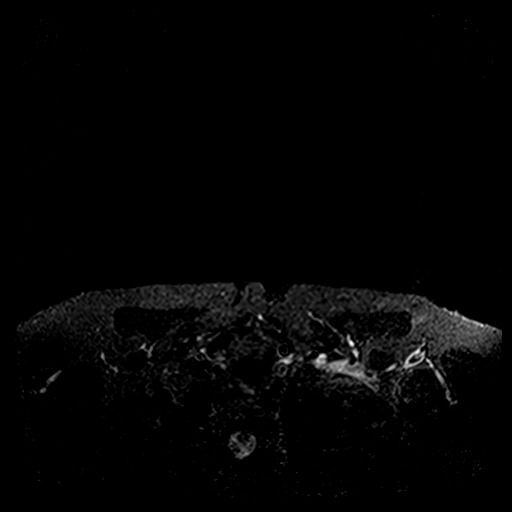

[Series 4: fl3d pre-cm no · axial · non-contrast · 1.2mm · 0.94mm/px · z∈[-83,+88]mm · 5 of 144 slices shown]
[im 1/144]
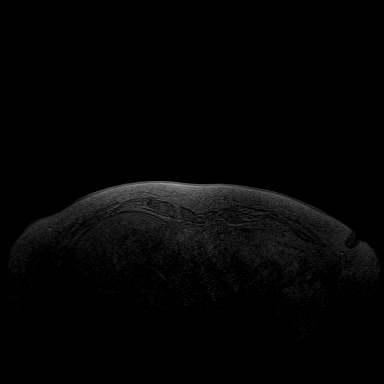
[im 36/144]
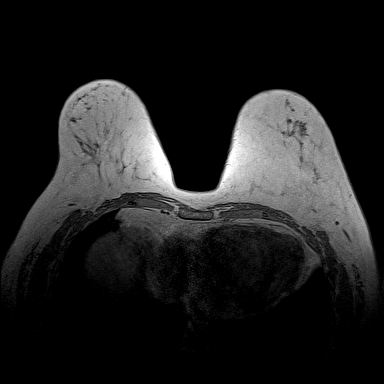
[im 72/144]
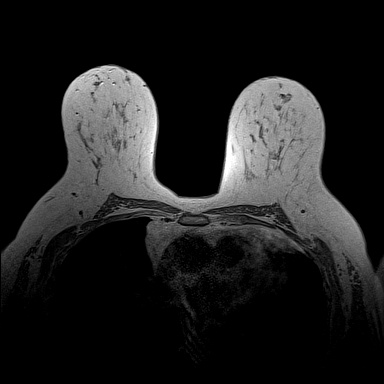
[im 108/144]
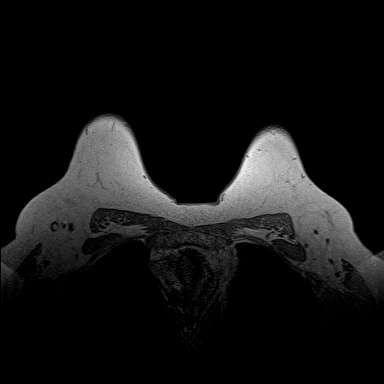
[im 144/144]
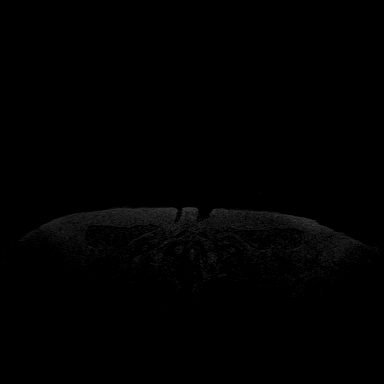

[Series 5: fl3d pre-cm · axial · non-contrast · 1.2mm · 0.94mm/px · z∈[-83,+88]mm · 5 of 144 slices shown]
[im 1/144]
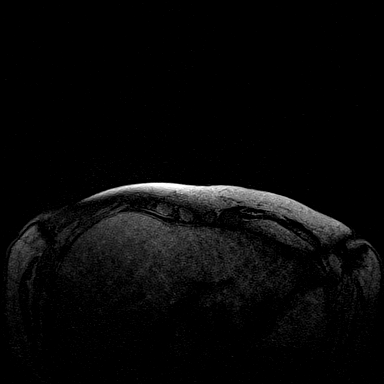
[im 36/144]
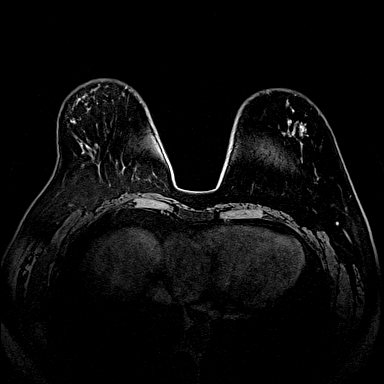
[im 72/144]
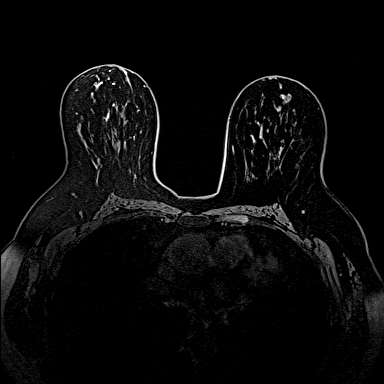
[im 108/144]
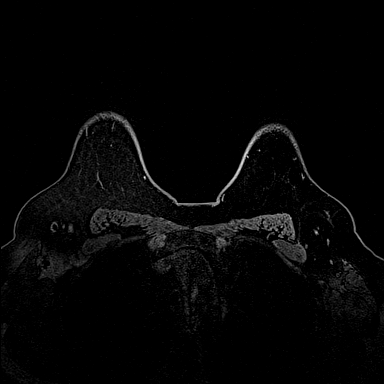
[im 144/144]
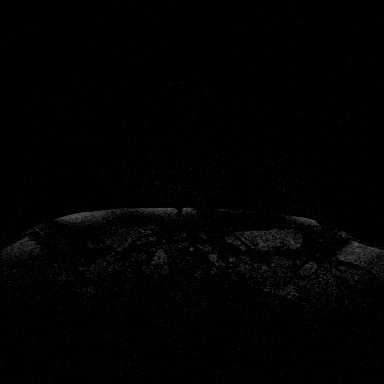

[Series 6: fl3d post-cm 20 · axial · 1.2mm · 0.94mm/px · z∈[-83,+88]mm · 5 of 144 slices shown (1 of 3)]
[im 1/144]
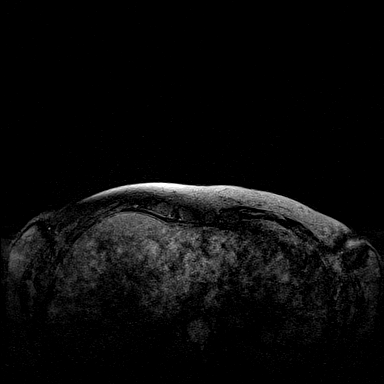
[im 36/144]
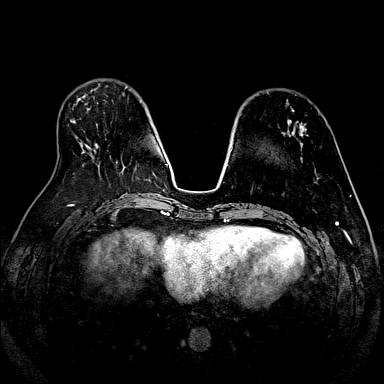
[im 72/144]
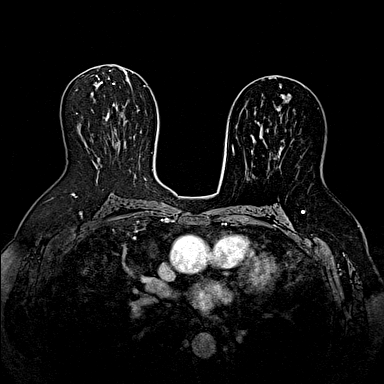
[im 108/144]
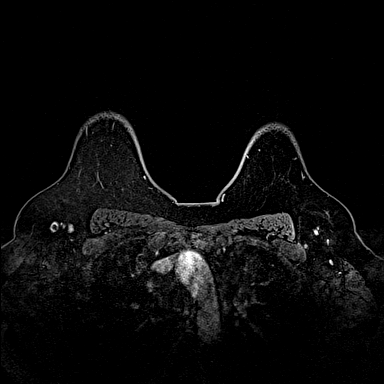
[im 144/144]
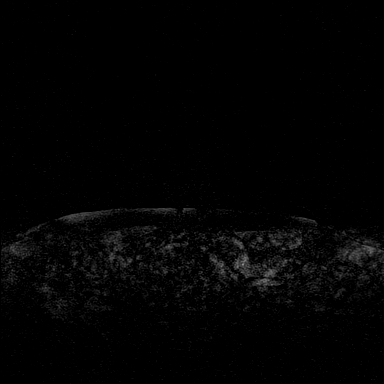

[Series 7: fl3d post-cm 20 · axial · 1.2mm · 0.94mm/px · z∈[-83,+88]mm · 5 of 144 slices shown (2 of 3)]
[im 1/144]
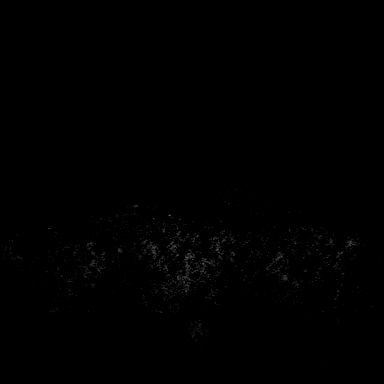
[im 36/144]
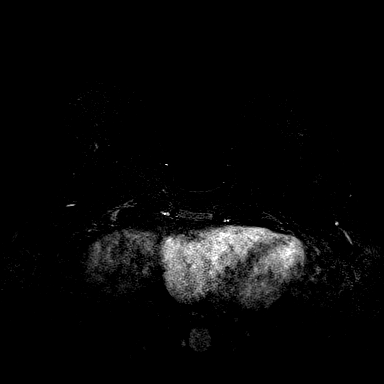
[im 72/144]
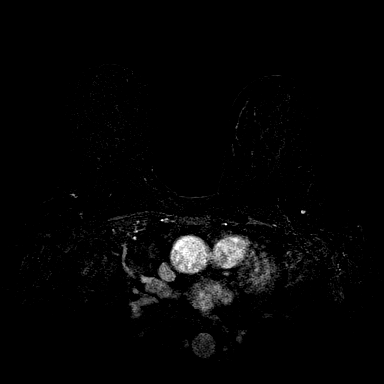
[im 108/144]
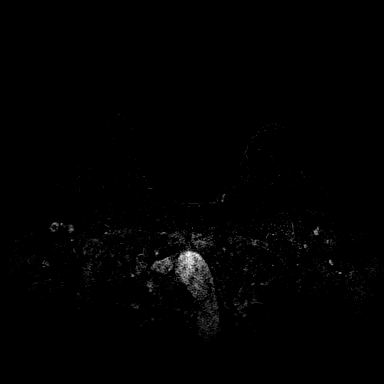
[im 144/144]
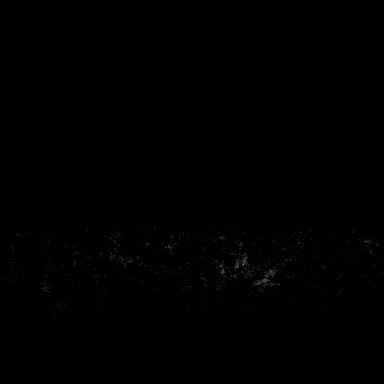

[Series 8: fl3d post-cm 20 · axial · 172.8mm · 0.94mm/px · 1 of 1 slices shown (3 of 3)]
[im 1/1]
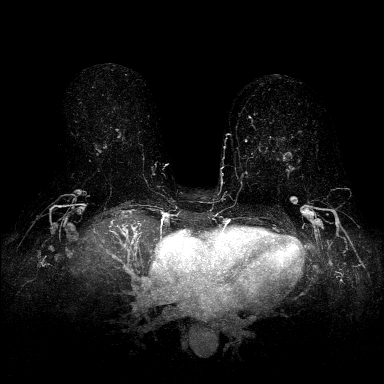

[Series 9: fl3d post-cm 3min · axial · 1.2mm · 0.94mm/px · z∈[-83,+45]mm · 4 of 144 slices shown]
[im 1/144]
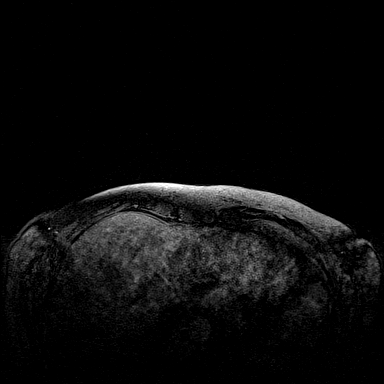
[im 36/144]
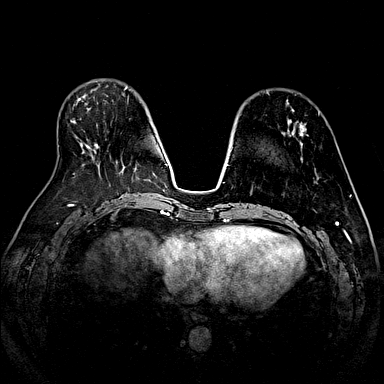
[im 72/144]
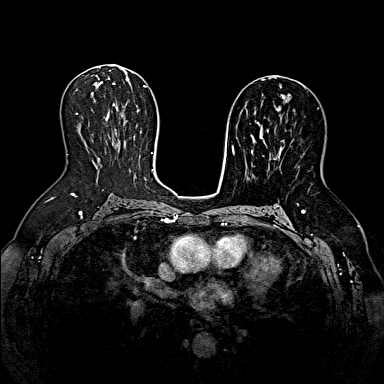
[im 108/144]
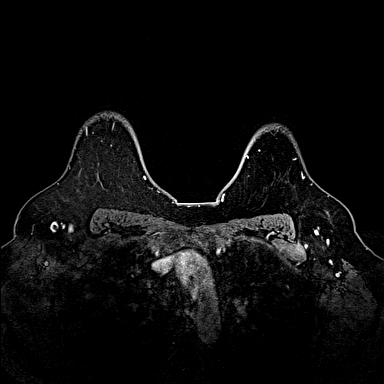

[30 of 48 positions shown; findings below may reference images not displayed]

THREE-DIMENSIONAL MR IMAGE RENDERING ON INDEPENDENT WORKSTATION:

Three-dimensional MR images were rendered by post-processing of the
original MR data on an independent workstation. The
three-dimensional MR images were interpreted, and findings are
reported in the following complete MRI report for this study. Three
dimensional images were evaluated at the independent [employer]
workstation.
Mammography with
tomosynthesis 05/15/2014, 03/31/2013, 03/30/2012 and digital
mammography 03/26/2011 and earlier. Left breast ultrasound
01/25/2009, 04/23/2005.
FINDINGS: Breast composition: b. Scattered fibroglandular tissue.

Background parenchymal enhancement: Mild to moderate with scattered
enhancing foci bilaterally.

Right breast: No mass or abnormal enhancement.

Left breast: Focal linear non mass enhancement involving the lower
outer quadrant at the approximate 5:30 o'clock position 7 cm from
the nipple, measuring approximately 2.8 x 1.5 x 1.7 cm,
demonstrating plateau kinetics. This enhancement is posterior and
inferior to the previously identified complex cyst or degenerating
fibroadenoma in the outer breast. This enhancement is a new finding
since the prior MRI. In review of the screening mammogram
05/15/2014, there is no discrete mammographic correlate, though
there may be subtle distortion in this region on the tomosynthesis
images.

No mass or abnormal enhancement elsewhere.

Lymph nodes: No abnormal appearing lymph nodes.

Ancillary findings:  None.
IMPRESSION: 1. Focal non mass enhancement involving the lower outer quadrant of
the left breast (approximately 5:30 o'clock, 7 cm from the nipple),
new since the prior MRI from 0848, maximum measurement 2.8 cm,
posterior and inferior to the previously identified benign
degenerating fibroadenoma or complex cyst. There is no discrete
mammographic correlate, though there may be subtle distortion in
this region on the screening tomosynthesis images 05/15/2014.
2. No MRI evidence of malignancy, right breast.
3. No pathologic lymphadenopathy.

RECOMMENDATION:
MRI guided biopsy of the focal non mass enhancement involving the
lower outer left breast.

BI-RADS CATEGORY  4: Suspicious.
